# Patient Record
Sex: Male | Born: 1965 | Race: Black or African American | Hispanic: No | Marital: Single | Smoking: Current every day smoker
Health system: Southern US, Community
[De-identification: ages and names within clinical notes are randomized; demographics above are authoritative.]

## PROBLEM LIST (undated history)

## (undated) DIAGNOSIS — J45909 Unspecified asthma, uncomplicated: Secondary | ICD-10-CM

## (undated) DIAGNOSIS — Z720 Tobacco use: Secondary | ICD-10-CM

---

## 2003-04-01 ENCOUNTER — Inpatient Hospital Stay (HOSPITAL_COMMUNITY): Admission: EM | Admit: 2003-04-01 | Discharge: 2003-04-02 | Payer: Self-pay | Admitting: *Deleted

## 2005-11-10 ENCOUNTER — Emergency Department (HOSPITAL_COMMUNITY): Admission: EM | Admit: 2005-11-10 | Discharge: 2005-11-10 | Payer: Self-pay | Admitting: Emergency Medicine

## 2010-05-07 ENCOUNTER — Emergency Department (HOSPITAL_COMMUNITY)
Admission: EM | Admit: 2010-05-07 | Discharge: 2010-05-07 | Disposition: A | Discharge status: Home / Self Care | Payer: Self-pay | Attending: Emergency Medicine | Admitting: Emergency Medicine

## 2010-05-07 DIAGNOSIS — J45909 Unspecified asthma, uncomplicated: Secondary | ICD-10-CM | POA: Insufficient documentation

## 2010-05-07 DIAGNOSIS — M545 Low back pain, unspecified: Secondary | ICD-10-CM | POA: Insufficient documentation

## 2010-05-07 DIAGNOSIS — Y929 Unspecified place or not applicable: Secondary | ICD-10-CM | POA: Insufficient documentation

## 2010-05-07 DIAGNOSIS — F17200 Nicotine dependence, unspecified, uncomplicated: Secondary | ICD-10-CM | POA: Insufficient documentation

## 2010-05-07 DIAGNOSIS — X500XXA Overexertion from strenuous movement or load, initial encounter: Secondary | ICD-10-CM | POA: Insufficient documentation

## 2010-05-07 DIAGNOSIS — S335XXA Sprain of ligaments of lumbar spine, initial encounter: Secondary | ICD-10-CM | POA: Insufficient documentation

## 2010-07-31 ENCOUNTER — Emergency Department (HOSPITAL_COMMUNITY)
Admission: EM | Admit: 2010-07-31 | Discharge: 2010-07-31 | Disposition: A | Discharge status: Home / Self Care | Payer: Self-pay | Attending: Emergency Medicine | Admitting: Emergency Medicine

## 2010-07-31 DIAGNOSIS — R05 Cough: Secondary | ICD-10-CM | POA: Insufficient documentation

## 2010-07-31 DIAGNOSIS — J45901 Unspecified asthma with (acute) exacerbation: Secondary | ICD-10-CM | POA: Insufficient documentation

## 2010-07-31 DIAGNOSIS — R059 Cough, unspecified: Secondary | ICD-10-CM | POA: Insufficient documentation

## 2011-10-19 ENCOUNTER — Emergency Department (HOSPITAL_COMMUNITY)
Admission: EM | Admit: 2011-10-19 | Discharge: 2011-10-19 | Disposition: A | Discharge status: Home / Self Care | Payer: Self-pay | Attending: Emergency Medicine | Admitting: Emergency Medicine

## 2011-10-19 ENCOUNTER — Emergency Department (HOSPITAL_COMMUNITY): Payer: Self-pay

## 2011-10-19 ENCOUNTER — Encounter (HOSPITAL_COMMUNITY): Payer: Self-pay

## 2011-10-19 DIAGNOSIS — J45909 Unspecified asthma, uncomplicated: Secondary | ICD-10-CM | POA: Insufficient documentation

## 2011-10-19 DIAGNOSIS — Z760 Encounter for issue of repeat prescription: Secondary | ICD-10-CM | POA: Insufficient documentation

## 2011-10-19 DIAGNOSIS — J9801 Acute bronchospasm: Secondary | ICD-10-CM

## 2011-10-19 DIAGNOSIS — J40 Bronchitis, not specified as acute or chronic: Secondary | ICD-10-CM | POA: Insufficient documentation

## 2011-10-19 DIAGNOSIS — F17200 Nicotine dependence, unspecified, uncomplicated: Secondary | ICD-10-CM | POA: Insufficient documentation

## 2011-10-19 HISTORY — DX: Unspecified asthma, uncomplicated: J45.909

## 2011-10-19 LAB — POCT I-STAT, CHEM 8
BUN: 12 mg/dL (ref 6–23)
Calcium, Ion: 1.17 mmol/L (ref 1.12–1.23)
Chloride: 111 mEq/L (ref 96–112)
Creatinine, Ser: 1.5 mg/dL — ABNORMAL HIGH (ref 0.50–1.35)
Glucose, Bld: 105 mg/dL — ABNORMAL HIGH (ref 70–99)
HCT: 42 % (ref 39.0–52.0)
Hemoglobin: 14.3 g/dL (ref 13.0–17.0)
Potassium: 4 mEq/L (ref 3.5–5.1)
TCO2: 22 mmol/L (ref 0–100)

## 2011-10-19 LAB — CBC WITH DIFFERENTIAL/PLATELET
Basophils Absolute: 0.1 10*3/uL (ref 0.0–0.1)
Eosinophils Relative: 6 % — ABNORMAL HIGH (ref 0–5)
HCT: 38.7 % — ABNORMAL LOW (ref 39.0–52.0)
Hemoglobin: 13.4 g/dL (ref 13.0–17.0)
Lymphocytes Relative: 32 % (ref 12–46)
Lymphs Abs: 2.7 10*3/uL (ref 0.7–4.0)
MCH: 29.7 pg (ref 26.0–34.0)
Monocytes Absolute: 0.5 10*3/uL (ref 0.1–1.0)
Monocytes Relative: 6 % (ref 3–12)
Neutro Abs: 4.6 10*3/uL (ref 1.7–7.7)
Neutrophils Relative %: 56 % (ref 43–77)
Platelets: 314 10*3/uL (ref 150–400)
RBC: 4.51 MIL/uL (ref 4.22–5.81)
WBC: 8.3 10*3/uL (ref 4.0–10.5)

## 2011-10-19 LAB — POCT I-STAT TROPONIN I: Troponin i, poc: 0 ng/mL (ref 0.00–0.08)

## 2011-10-19 MED ORDER — ALBUTEROL SULFATE HFA 108 (90 BASE) MCG/ACT IN AERS
2.0000 | INHALATION_SPRAY | RESPIRATORY_TRACT | Status: DC | PRN
  Administered 2011-10-19: 2 via RESPIRATORY_TRACT
  Filled 2011-10-19: qty 6.7

## 2011-10-19 MED ORDER — PREDNISONE 20 MG PO TABS: 20.0000 mg | ORAL_TABLET | Freq: Two times a day (BID) | ORAL | Status: AC

## 2011-10-19 MED ORDER — AEROCHAMBER Z-STAT PLUS/MEDIUM MISC
1.0000 | Freq: Once | Status: AC
  Administered 2011-10-19: 1
  Filled 2011-10-19: qty 1

## 2011-10-19 MED ORDER — ALBUTEROL SULFATE HFA 108 (90 BASE) MCG/ACT IN AERS
2.0000 | INHALATION_SPRAY | RESPIRATORY_TRACT | Status: DC | PRN
Start: 2011-10-19 — End: 2015-04-13

## 2011-10-19 MED ORDER — PREDNISONE 20 MG PO TABS
60.0000 mg | ORAL_TABLET | Freq: Once | ORAL | Status: AC
  Administered 2011-10-19: 60 mg via ORAL
  Filled 2011-10-19: qty 3

## 2011-10-19 NOTE — ED Provider Notes (Addendum)
History     CSN: [id number]  Arrival date & time 10/19/11  1619   First MD Initiated Contact with Patient 10/19/11 2032      Chief Complaint  Patient presents with  . Asthma  . Shortness of Breath  . Medication Refill    (Consider location/radiation/quality/duration/timing/severity/associated sxs/prior treatment) HPI Comments: Luis Gay. is a 46 y.o. Male who was using his mother's inhaler, ran out, and has worsening, shortness of breath since. He denies cough, fever, or chills. He has mild, chest discomfort earlier, but it resolved while in the emergency department. No nausea, vomiting, sweating, weakness, dizziness, paresthesias, headache. He smokes cigarettes. There no known aggravating or palliative factors.  Patient is a 46 y.o. male presenting with asthma and shortness of breath. The history is provided by the patient.  Asthma Associated symptoms include shortness of breath.  Shortness of Breath  Associated symptoms include shortness of breath. His past medical history is significant for asthma.    Past Medical History  Diagnosis Date  . Asthma     History reviewed. No pertinent past surgical history.  History reviewed. No pertinent family history.  History  Substance Use Topics  . Smoking status: Current Everyday Smoker  . Smokeless tobacco: Not on file  . Alcohol Use: Yes      Review of Systems  Respiratory: Positive for shortness of breath.   All other systems reviewed and are negative.    Allergies  Review of patient's allergies indicates no known allergies.  Home Medications   Current Outpatient Rx  Name Route Sig Dispense Refill  . ALBUTEROL SULFATE HFA 108 (90 BASE) MCG/ACT IN AERS Inhalation Inhale 2 puffs into the lungs every 6 (six) hours as needed. For shortness of breath    . ALBUTEROL SULFATE HFA 108 (90 BASE) MCG/ACT IN AERS Inhalation Inhale 2 puffs into the lungs every 4 (four) hours as needed for wheezing. 8.5 g 0  .  PREDNISONE 20 MG PO TABS Oral Take 1 tablet (20 mg total) by mouth 2 (two) times daily. 10 tablet 0    BP 149/95  Pulse 80  Temp 98.1 F (36.7 C) (Oral)  Resp 15  SpO2 99%  Physical Exam  Nursing note and vitals reviewed. Constitutional: He is oriented to person, place, and time. He appears well-developed and well-nourished.  HENT:  Head: Normocephalic and atraumatic.  Right Ear: External ear normal.  Left Ear: External ear normal.  Eyes: Conjunctivae and EOM are normal. Pupils are equal, round, and reactive to light.  Neck: Normal range of motion and phonation normal. Neck supple.  Cardiovascular: Normal rate, regular rhythm, normal heart sounds and intact distal pulses.   Pulmonary/Chest: Effort normal. No respiratory distress. He has wheezes (generalized). He has no rales. He exhibits no tenderness and no bony tenderness.       No increased work of breathing.  Abdominal: Soft. Normal appearance. There is no tenderness.  Musculoskeletal: Normal range of motion.  Neurological: He is alert and oriented to person, place, and time. He has normal strength. No cranial nerve deficit or sensory deficit. He exhibits normal muscle tone. Coordination normal.  Skin: Skin is warm, dry and intact.  Psychiatric: He has a normal mood and affect. His behavior is normal. Judgment and thought content normal.    ED Course  Procedures (including critical care time)  Emergency department treatment: Prednisone, albuterol inhaler, and AeroChamber.    Date: 11/23/18  Rate: 111  Rhythm: sinus tachycardia  QRS Axis: normal  Intervals: normal  ST/T Wave abnormalities: nonspecific T wave changes  Conduction Disutrbances:none  Narrative Interpretation: poor R wave progression  Old EKG Reviewed: rate faster    Labs Reviewed  CBC WITH DIFFERENTIAL - Abnormal; Notable for the following:    HCT 38.7 (*)     Eosinophils Relative 6 (*)     All other components within normal limits  POCT I-STAT,  CHEM 8 - Abnormal; Notable for the following:    Creatinine, Ser 1.50 (*)     Glucose, Bld 105 (*)     All other components within normal limits  POCT I-STAT TROPONIN I   Dg Chest 2 View  10/19/2011  *RADIOLOGY REPORT*  Clinical Data: Shortness of breath and cough.  CHEST - 2 VIEW  Comparison: None.  Findings: Trachea is midline.  Heart size normal.  Lungs are clear. No pleural fluid.  IMPRESSION: No acute findings.  Original Report Authenticated By: Reyes Ivan, M.D.     1. Bronchitis   2. Bronchospasm       MDM  Bronchitis, with uncal spasm in smoker. No productive cough. Doubt ACS, PE, PNE. Doubt metabolic instability, serious bacterial infection or impending vascular collapse; the patient is stable for discharge.   Plan: Home Medications- Albuterol,Prednisone; Home Treatments- stop smoking; Recommended follow up- PCP of choice asap        Flint Melter, MD 10/20/11 0020  Flint Melter, MD 11/23/11 1651

## 2011-10-19 NOTE — ED Notes (Signed)
Pt d/c home. Verbalized understanding of inhaler and spacer. Demonstrated proper application. Verbalized signs and symptoms that indicate need to seek further treatment. A.O. X 4. Ambulatory. Skin warm, dry, and intact. Vitals stable. NAD. Parents at beside.

## 2011-10-19 NOTE — ED Notes (Signed)
Pt complains of hx of asthma and ran out of meds sevreal days ago, sts he feels sob and is getting worse.

## 2011-10-19 NOTE — ED Notes (Addendum)
Pt reports feeling SOB and x 3 days. States " He ran out of asthma medication, and today his SOB got worse". Cough is non productive.  Reports chest pain "when taking a deep breath or coughing". Non radiating. Pt denies any cardiac history. 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 Respirations even and unlabored. RR 18. SpO2 98% on 2L [place]. Pt. Sitting up in high fowler's. NAD.  Skin warm, dry, and intact. No further needs at this time.

## 2011-10-19 NOTE — ED Notes (Signed)
Pt. Reports SOB has decreased. Maintained on 2L [place] for comfort measures. States chest pain has decreased. 3/10.

## 2013-09-02 ENCOUNTER — Inpatient Hospital Stay (HOSPITAL_COMMUNITY)
Admission: EM | Admit: 2013-09-02 | Discharge: 2013-09-06 | DRG: 203 | Disposition: A | Discharge status: Home / Self Care | Payer: No Typology Code available for payment source | Attending: Internal Medicine | Admitting: Internal Medicine

## 2013-09-02 ENCOUNTER — Emergency Department (HOSPITAL_COMMUNITY): Payer: No Typology Code available for payment source

## 2013-09-02 ENCOUNTER — Encounter (HOSPITAL_COMMUNITY): Payer: Self-pay | Admitting: Emergency Medicine

## 2013-09-02 DIAGNOSIS — F1210 Cannabis abuse, uncomplicated: Secondary | ICD-10-CM | POA: Diagnosis present

## 2013-09-02 DIAGNOSIS — F17200 Nicotine dependence, unspecified, uncomplicated: Secondary | ICD-10-CM | POA: Diagnosis present

## 2013-09-02 DIAGNOSIS — I498 Other specified cardiac arrhythmias: Secondary | ICD-10-CM | POA: Diagnosis present

## 2013-09-02 DIAGNOSIS — R000 Tachycardia, unspecified: Secondary | ICD-10-CM

## 2013-09-02 DIAGNOSIS — E876 Hypokalemia: Secondary | ICD-10-CM | POA: Diagnosis present

## 2013-09-02 DIAGNOSIS — J45901 Unspecified asthma with (acute) exacerbation: Principal | ICD-10-CM | POA: Diagnosis present

## 2013-09-02 DIAGNOSIS — Z79899 Other long term (current) drug therapy: Secondary | ICD-10-CM

## 2013-09-02 DIAGNOSIS — F17210 Nicotine dependence, cigarettes, uncomplicated: Secondary | ICD-10-CM

## 2013-09-02 HISTORY — DX: Tobacco use: Z72.0

## 2013-09-02 LAB — CBC
HCT: 37.8 % — ABNORMAL LOW (ref 39.0–52.0)
Hemoglobin: 13.2 g/dL (ref 13.0–17.0)
MCH: 29.5 pg (ref 26.0–34.0)
MCHC: 34.9 g/dL (ref 30.0–36.0)
MCV: 84.4 fL (ref 78.0–100.0)
Platelets: 284 10*3/uL (ref 150–400)
RBC: 4.48 MIL/uL (ref 4.22–5.81)
RDW: 13.3 % (ref 11.5–15.5)
WBC: 6.7 10*3/uL (ref 4.0–10.5)

## 2013-09-02 LAB — BASIC METABOLIC PANEL
BUN: 11 mg/dL (ref 6–23)
CALCIUM: 9.4 mg/dL (ref 8.4–10.5)
CO2: 22 meq/L (ref 19–32)
Chloride: 100 mEq/L (ref 96–112)
Creatinine, Ser: 1 mg/dL (ref 0.50–1.35)
GFR calc Af Amer: >90 mL/min (ref >90)
GFR calc non Af Amer: 87 mL/min — ABNORMAL LOW (ref >90)
Glucose, Bld: 261 mg/dL — ABNORMAL HIGH (ref 70–99)
Potassium: 3.1 mEq/L — ABNORMAL LOW (ref 3.7–5.3)
Sodium: 141 mEq/L (ref 137–147)

## 2013-09-02 LAB — GLUCOSE, CAPILLARY: GLUCOSE-CAPILLARY: 243 mg/dL — AB (ref 70–99)

## 2013-09-02 MED ORDER — ALBUTEROL SULFATE (2.5 MG/3ML) 0.083% IN NEBU: 2.5000 mg | INHALATION_SOLUTION | RESPIRATORY_TRACT | Status: DC | PRN

## 2013-09-02 MED ORDER — HEPARIN SODIUM (PORCINE) 5000 UNIT/ML IJ SOLN
5000.0000 [IU] | Freq: Three times a day (TID) | INTRAMUSCULAR | Status: DC
  Filled 2013-09-02 (×2): qty 1

## 2013-09-02 MED ORDER — ACETAMINOPHEN 650 MG RE SUPP
650.0000 mg | Freq: Four times a day (QID) | RECTAL | Status: DC | PRN
Start: 2013-09-02 — End: 2013-09-06

## 2013-09-02 MED ORDER — ONDANSETRON HCL 4 MG PO TABS: 4.0000 mg | ORAL_TABLET | Freq: Four times a day (QID) | ORAL | Status: DC | PRN

## 2013-09-02 MED ORDER — INSULIN ASPART 100 UNIT/ML ~~LOC~~ SOLN
0.0000 [IU] | Freq: Three times a day (TID) | SUBCUTANEOUS | Status: DC
  Administered 2013-09-03 – 2013-09-04 (×4): 1 [IU] via SUBCUTANEOUS
  Administered 2013-09-05: 2 [IU] via SUBCUTANEOUS
  Administered 2013-09-05: 3 [IU] via SUBCUTANEOUS
  Administered 2013-09-05: 2 [IU] via SUBCUTANEOUS
  Administered 2013-09-06: 3 [IU] via SUBCUTANEOUS

## 2013-09-02 MED ORDER — AZITHROMYCIN 500 MG PO TABS
500.0000 mg | ORAL_TABLET | Freq: Every day | ORAL | Status: AC
  Administered 2013-09-02: 500 mg via ORAL
  Filled 2013-09-02: qty 1

## 2013-09-02 MED ORDER — ONDANSETRON HCL 4 MG/2ML IJ SOLN: 4.0000 mg | Freq: Four times a day (QID) | INTRAMUSCULAR | Status: DC | PRN

## 2013-09-02 MED ORDER — INSULIN ASPART 100 UNIT/ML ~~LOC~~ SOLN
0.0000 [IU] | Freq: Every day | SUBCUTANEOUS | Status: DC
  Administered 2013-09-02 – 2013-09-04 (×2): 2 [IU] via SUBCUTANEOUS

## 2013-09-02 MED ORDER — ACETAMINOPHEN 325 MG PO TABS: 650.0000 mg | ORAL_TABLET | Freq: Four times a day (QID) | ORAL | Status: DC | PRN

## 2013-09-02 MED ORDER — POTASSIUM CHLORIDE CRYS ER 20 MEQ PO TBCR
40.0000 meq | EXTENDED_RELEASE_TABLET | Freq: Once | ORAL | Status: AC
  Administered 2013-09-02: 40 meq via ORAL
  Filled 2013-09-02: qty 2

## 2013-09-02 MED ORDER — BISACODYL 5 MG PO TBEC
5.0000 mg | DELAYED_RELEASE_TABLET | Freq: Every day | ORAL | Status: DC | PRN
Start: 2013-09-02 — End: 2013-09-06

## 2013-09-02 MED ORDER — ALBUTEROL (5 MG/ML) CONTINUOUS INHALATION SOLN
10.0000 mg/h | INHALATION_SOLUTION | Freq: Once | RESPIRATORY_TRACT | Status: AC
  Administered 2013-09-02: 10 mg/h via RESPIRATORY_TRACT

## 2013-09-02 MED ORDER — IPRATROPIUM-ALBUTEROL 0.5-2.5 (3) MG/3ML IN SOLN
3.0000 mL | RESPIRATORY_TRACT | Status: DC
Start: 2013-09-02 — End: 2013-09-06
  Administered 2013-09-02 – 2013-09-06 (×21): 3 mL via RESPIRATORY_TRACT
  Filled 2013-09-02 (×22): qty 3

## 2013-09-02 MED ORDER — METHYLPREDNISOLONE SODIUM SUCC 125 MG IJ SOLR
60.0000 mg | Freq: Two times a day (BID) | INTRAMUSCULAR | Status: DC
  Administered 2013-09-02: 23:00:00 via INTRAVENOUS
  Filled 2013-09-02 (×3): qty 0.96

## 2013-09-02 MED ORDER — AZITHROMYCIN 250 MG PO TABS
250.0000 mg | ORAL_TABLET | Freq: Every day | ORAL | Status: AC
  Administered 2013-09-03 – 2013-09-06 (×4): 250 mg via ORAL
  Filled 2013-09-02 (×5): qty 1

## 2013-09-02 MED ORDER — POLYETHYLENE GLYCOL 3350 17 G PO PACK
17.0000 g | PACK | Freq: Every day | ORAL | Status: DC | PRN
  Filled 2013-09-02: qty 1

## 2013-09-02 MED ORDER — BUDESONIDE 0.5 MG/2ML IN SUSP
0.5000 mg | Freq: Two times a day (BID) | RESPIRATORY_TRACT | Status: DC
  Administered 2013-09-02 – 2013-09-06 (×8): 0.5 mg via RESPIRATORY_TRACT
  Filled 2013-09-02 (×15): qty 2

## 2013-09-02 MED ORDER — ENOXAPARIN SODIUM 40 MG/0.4ML ~~LOC~~ SOLN
40.0000 mg | Freq: Every day | SUBCUTANEOUS | Status: DC
  Administered 2013-09-02: 40 mg via SUBCUTANEOUS
  Filled 2013-09-02 (×2): qty 0.4

## 2013-09-02 MED ORDER — ARFORMOTEROL TARTRATE 15 MCG/2ML IN NEBU
15.0000 ug | INHALATION_SOLUTION | Freq: Two times a day (BID) | RESPIRATORY_TRACT | Status: DC
  Administered 2013-09-02 – 2013-09-06 (×8): 15 ug via RESPIRATORY_TRACT
  Filled 2013-09-02 (×12): qty 2

## 2013-09-02 NOTE — ED Notes (Signed)
Patient states he was taking proair but did not have money to get refill and providers [facility] gave him proventil HFA instead, which he states has not been helping. Patient denies using anyother medications for asthma.

## 2013-09-02 NOTE — ED Notes (Signed)
Bed: WA01 Expected date:  Expected time:  Means of arrival:  Comments: ems- asthma

## 2013-09-02 NOTE — ED Provider Notes (Signed)
  Face-to-face evaluation   History: Shortness of breath. Today, somewhat improved after initial treatment. In a similar recently.  Physical exam: Alert, cooperative. Mild increased work of breathing. Lungs with decreased air movement bilaterally, and generalized wheezing. This exam was after the first continuous nebulizer treatment.  Medical screening examination/treatment/procedure(s) were conducted as a shared visit with non-physician practitioner(s) and myself.  I personally evaluated the patient during the encounter  Flint MelterElliott L Wentz, MD 09/03/13 [phone]

## 2013-09-02 NOTE — ED Provider Notes (Signed)
CSN: [id number]     Arrival date & time 09/02/13  1502 History   First MD Initiated Contact with Patient 09/02/13 1512     Chief Complaint  Patient presents with  . 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  Pt states he called EMS around 11am this morning, was given 1 nebulizer tx, felt better at that time and declined transfer to ED.  Around 3pm, asthma flared up again, pt called EMS and was given 10mg  albuterol, 0.5mg  atrovent and 125mg  solu-medrol just PTA.  Pt still c/o dyspnea upon arrival to ED.  Pt is alert and oriented, denies chest pain but c/o chest tightness.  States he has been hospitalized about 7874yr ago for his asthma. Pt is a smoker, smokes about 1 pack every 3 days.  States he does have an albuterol inhaler at home which he has needed to use more often today w/o relief. States he was taking ProAir but did not have money to refill and providers [facility] gave him Proventil HFA instead, which he states has not been helping. Denies any other daily medications.  Denies recent illness including fever, congestion, n/v/d. Pt does report dry cough consistent with his asthma attack. No sick contact or recent travel.  Denies hx of CAD. Denies hx of blood clots, leg pain or leg swelling.   Past Medical History  Diagnosis Date  . Asthma    History reviewed. No pertinent past surgical history. History reviewed. No pertinent family history. History  Substance Use Topics  . Smoking status: Current Every Day Smoker  . Smokeless tobacco: Not on file  . Alcohol Use: Yes    Review of Systems  Constitutional: Negative for fever, chills, diaphoresis and fatigue.  HENT: Negative for congestion, sore throat, trouble swallowing and voice change.   Respiratory: Positive  for cough ( dry), chest tightness, shortness of breath and wheezing. Negative for choking and stridor.   Cardiovascular: Negative for chest pain and palpitations.  Gastrointestinal: Negative for nausea, vomiting, abdominal pain and diarrhea.  All other systems reviewed and are negative.     Allergies  Review of patient's allergies indicates no known allergies.  Home Medications   Prior to Admission medications   Medication Sig Start Date End Date Taking? Authorizing Provider  albuterol (PROVENTIL HFA;VENTOLIN HFA) 108 (90 BASE) MCG/ACT inhaler Inhale 2 puffs into the lungs every 6 (six) hours as needed. For shortness of breath   Yes Historical Provider, MD  albuterol (PROVENTIL HFA;VENTOLIN HFA) 108 (90 BASE) MCG/ACT inhaler Inhale 2 puffs into the lungs every 4 (four) hours as needed for wheezing. 10/19/11 10/18/12  Flint MelterElliott L Wentz, MD   BP 126/70  Pulse 118  Temp(Src) 98.4 F (36.9 C) (Oral)  Resp 18  SpO2 98% Physical Exam  Nursing note and vitals reviewed. Constitutional: He appears well-developed and well-nourished.  HENT:  Head: Normocephalic and atraumatic.  Eyes: Conjunctivae are normal. No scleral icterus.  Neck: Normal range of motion.  Cardiovascular: Regular rhythm and normal heart sounds.  Tachycardia present.   Mildly tachycardic   Pulmonary/Chest: He is in respiratory distress ( mild). He has wheezes. He has no rales. He exhibits no tenderness.  Mild respiratory distress, taking a deep breath between sentences.  Diffuse inspiratory and expiratory wheeze in all lung fields. No  rhonchi or rales. No chest wall tenderness.   Abdominal: Soft. Bowel sounds are normal. He exhibits no distension and no mass. There is no tenderness. There is no rebound and no guarding.  Musculoskeletal: Normal range of motion.  Neurological: He is alert.  Skin: Skin is warm and dry.    ED Course  Procedures (including critical care time) Labs Review Labs Reviewed - No data to  display  Imaging Review Dg Chest 2 View  09/02/2013   CLINICAL DATA:  Chest pain, cough, congestion, shortness of breath.  EXAM: CHEST  2 VIEW  COMPARISON:  10/19/2011  FINDINGS: Cardiomediastinal silhouette is within normal limits. The lungs are well inflated. There is a 7 mm round density projecting over the right mid to lower lung on the PA image, not clearly seen on the prior study. The lungs are otherwise clear. No pleural effusion or pneumothorax is identified. No acute osseous abnormality is seen.  IMPRESSION: 1. No evidence of acute airspace disease. 2. 6 mm density overlying the right lung, possibly a nipple shadow. Consider repeat radiograph with nipple markers.   Electronically Signed   By: Sebastian AcheAllen  Grady   On: 09/02/2013 19:19     EKG Interpretation None      MDM   Final diagnoses:  Asthma exacerbation    Pt is a 48yo male with hx of asthma, smokes about 1 pack cigarrettes every 3 days, works outside as a Scientist, water qualitybrick mason, presenting to ED via EMS with asthma exacerbation.  Pt reports having 2 tx of albuterol and atrovent neb via EMS PTA with solu-medrol given PTA.  Pt denies recent illness.  Pt does have mild respiratory distress, speaking in broken sentences with diffuse inspiratory and expiratory wheeze.  Will given continuous 1 hour long neb and reassess. PERC negative. Pt denies CP, low risk for ACS, not concerned for pneumonia, do not believe CXR needed at this time. Not concerned for pneumothorax, pt denies hx of trauma.   4:23 PM Pt started continuous neb tx around 3:27PM, states he is feeling a lot better, medication still left in neb tx.  Wheezing still present but has improved. Will allow pt to complete tx and reassess again. Due to not improving fully after 1st continuous neb tx, will get CXR to ensure no other underlying cause of asthma exacerbation such as CHF.   7:28 PM CXR- no evidence of acute airspace disease.  Discussed findings with pt.  Pt states 2nd continuous neb tx  did not help more than the 1st tx. Pt states he feels like if he was discharged now, he would be back with an asthma exacerbation as pt states he still feels winded.  Lungs: diffuse inspiratory and expiratory wheeze throughout all lung fields.  Discussed pt with Dr. Effie ShyWentz, mild tachycardia present, 118, likely due to albuterol tx.  Will call hospitalist to admit pt for asthma exacerbation tx.    7:38 PM Consulted with Dr. Renae FickleMacKenzie Short, internal medicine, pt will be admitted to a med-surg bed. Pt is stable at this time.       Junius Finnerrin O'Malley, PA-C 09/02/13 1939

## 2013-09-02 NOTE — ED Notes (Signed)
PER EMS[place] asthma x1 [place]. PTA given 10mg  albuterol .5 atrovent and 125mg  solu-medrol.  18g LAC.  Hx of asthma.  Arrived to ED on 8l of o2 on neb treatment. Alert and oriented per baseline.

## 2013-09-02 NOTE — H&P (Addendum)
[facility] History and Physical  Luis Hitcheginald A Vickroy Jr. ZOX:[id number] DOB: 11-08-1965 DOA: 09/02/2013  Referring physician:  Gray BernhardtElliot Wentz PCP:  Default, Provider, MD   Chief Complaint:  SOB  HPI:  The patient is a 48 y.o. year-old male with history of asthma since birth who presents with wheezing, shortness of breath, increased cough since this morning.   At baseline, the patient is not on any controller medications, however he has daytime and nighttime cough on a daily basis. He uses is albuterol inhaler at least 2-3 times every day.  His turgor is included colds, but temperature extremes are his worst trigger and he works outside.  He has on average 2-3 exacerbations per year and has been hospitalized previously. This exacerbation feels less severe than some of his previous exacerbations. He denies any recent fevers or chills, productive cough, sinus congestion. Yesterday he felt fine, however this morning he woke up feeling slightly more short of breath. He went to work, but needed to use his albuterol inhaler frequently. He called EMS at work and they gave him a breathing treatment which helped so he went home. After he arrived home, he immediately started feeling more short of breath again, and called EMS to pick him up and taken to the hospital.  In the emergency department, he received Solu-Medrol 125 mg IV once, 2 rounds of continuous albuterol times one hour. He remained stable on room air, but is still feeling considerably short of breath compared to his baseline and even at rest. He is being admitted for asthma exacerbation.  Review of Systems:  General:  Denies fevers, chills, weight loss or weight gain  HEENT:  Denies changes to hearing and vision, rhinorrhea, sinus congestion, sore throat CV:  Denies chest pain and palpitations, lower extremity edema.  PULM:   per history of present illness  GI:  Denies nausea, vomiting, constipation, diarrhea.   GU:  Denies dysuria,  frequency, urgency ENDO:  Denies polyuria, polydipsia.   HEME:  Denies hematemesis, blood in stools, melena, abnormal bruising or bleeding.  LYMPH:  Denies lymphadenopathy.   MSK:  Denies arthralgias, myalgias.   DERM:  Denies skin rash or ulcer.   NEURO:  Denies focal numbness, weakness, slurred speech, confusion, facial droop.  PSYCH:  Denies anxiety and depression.    Past Medical History  Diagnosis Date  . Asthma   . Tobacco use    History reviewed. No pertinent past surgical history. Social History:  reports that he has been smoking Cigarettes.  He has a 6 pack-year smoking history. He does not have any smokeless tobacco history on file. He reports that he drinks alcohol. He reports that he uses illicit drugs (Marijuana). Works outside  No Known Allergies  Family History  Problem Relation Age of Onset  . Asthma Father   . Diabetes    . High blood pressure       Prior to Admission medications   Medication Sig Start Date End Date Taking? Authorizing Provider  albuterol (PROVENTIL HFA;VENTOLIN HFA) 108 (90 BASE) MCG/ACT inhaler Inhale 2 puffs into the lungs every 6 (six) hours as needed. For shortness of breath   Yes Historical Provider, MD  albuterol (PROVENTIL HFA;VENTOLIN HFA) 108 (90 BASE) MCG/ACT inhaler Inhale 2 puffs into the lungs every 4 (four) hours as needed for wheezing. 10/19/11 10/18/12  Flint MelterElliott L Wentz, MD   Physical Exam: Filed Vitals:   09/02/13 1511 09/02/13 1532 09/02/13 1729 09/02/13 1918  BP: 152/84   126/70  Pulse:  105   118  Temp: 98.4 F (36.9 C)     TempSrc: Oral     Resp: 22   18  SpO2: 98% 98% 98% 98%     General:  Black male, able to speak in full sentences but with obvious wheezing and increased work of breathing   Eyes:  PERRL, anicteric, non-injected.  ENT:  Nares clear.  OP clear, non-erythematous without plaques or exudates.  MMM.  Neck:  Supple without TM or JVD.    Lymph:  No cervical, supraclavicular, or submandibular  LAD.  Cardiovascular:  RRR, normal S1, S2, without m/r/g.  2+ pulses, warm extremities  Respiratory:   diminished bilateral breath sounds with full inspiratory and expiratory wheeze, no focal rales or rhonchi, forced expiratory phase with I:E of 1:4-5 and occasional SCM retractions when speaking   Abdomen:  NABS.  Soft, ND/NT.    Skin:  No rashes or focal lesions.  Musculoskeletal:  Normal bulk and tone.  No LE edema.  Psychiatric:  A & O x 4.  Appropriate affect.  Neurologic:  CN 3-12 intact.  5/5 strength.  Sensation intact.  Labs on Admission:  Basic Metabolic Panel: No results found for this basename: NA, K, CL, CO2, GLUCOSE, BUN, CREATININE, CALCIUM, MG, PHOS,  in the last 168 hours Liver Function Tests: No results found for this basename: AST, ALT, ALKPHOS, BILITOT, PROT, ALBUMIN,  in the last 168 hours No results found for this basename: LIPASE, AMYLASE,  in the last 168 hours No results found for this basename: AMMONIA,  in the last 168 hours CBC: No results found for this basename: WBC, NEUTROABS, HGB, HCT, MCV, PLT,  in the last 168 hours Cardiac Enzymes: No results found for this basename: CKTOTAL, CKMB, CKMBINDEX, TROPONINI,  in the last 168 hours  BNP (last 3 results) No results found for this basename: PROBNP,  in the last 8760 hours CBG: No results found for this basename: GLUCAP,  in the last 168 hours  Radiological Exams on Admission: Dg Chest 2 View  09/02/2013   CLINICAL DATA:  Chest pain, cough, congestion, shortness of breath.  EXAM: CHEST  2 VIEW  COMPARISON:  10/19/2011  FINDINGS: Cardiomediastinal silhouette is within normal limits. The lungs are well inflated. There is a 7 mm round density projecting over the right mid to lower lung on the PA image, not clearly seen on the prior study. The lungs are otherwise clear. No pleural effusion or pneumothorax is identified. No acute osseous abnormality is seen.  IMPRESSION: 1. No evidence of acute airspace disease.  2. 6 mm density overlying the right lung, possibly a nipple shadow. Consider repeat radiograph with nipple markers.   Electronically Signed   By: Sebastian AcheAllen  Grady   On: 09/02/2013 19:19    EKG: pending  Assessment/Plan Active Problems:   Asthma exacerbation   Cigarette smoker one half pack a day or less   Tachycardia  ---  Asthma exacerbation superimposed on severe persistent asthma -  Encourage smoking cessation -  Continue Solu-Medrol -  DuoNeb every 4 hours with albuterol every 2 hours when necessary -  Given smoking history, he may have some underlying COPD so will start him on antibiotics -  We will start brovana and pulmicort for now, and he will need outpatient controller medications  -  Recommend outpatient Pulm f/u to determine if he also has some COPD and to assist with maintenance -  Repeat CXR due to possible nipple shadow on initial CXR  Tachycardia, likely sinus tach.   -  ECG  Tobacco and marijuana use -  Counseled cessation  Previous creatinine of 1.5 several years ago -  Check creatinine:  wnl  Hyperglycemia, likely steroid induced, but will eval for diabetes -  A1c -  Start SSI -  Change to diabetic diet  Hypokalemia, likely due to albuterol -  Oral potassium repletion  Diet:  diabetic Access:   PIV  IVF:   off  Proph:  lovenox  Code Status: Full  Family Communication:  patient alone  Disposition Plan: Admit to MedSurg   Time spent: 60 min SHORT, MACKENZIE [facility] Pager [phone]  If 7PM-7AM, please contact night-coverage [email or web address] Password [facility] 09/02/2013, 8:27 PM

## 2013-09-03 ENCOUNTER — Encounter (HOSPITAL_COMMUNITY): Payer: Self-pay | Admitting: *Deleted

## 2013-09-03 ENCOUNTER — Inpatient Hospital Stay (HOSPITAL_COMMUNITY): Payer: No Typology Code available for payment source

## 2013-09-03 LAB — GLUCOSE, CAPILLARY
Glucose-Capillary: 122 mg/dL — ABNORMAL HIGH (ref 70–99)
Glucose-Capillary: 125 mg/dL — ABNORMAL HIGH (ref 70–99)

## 2013-09-03 LAB — HEMOGLOBIN A1C
Hgb A1c MFr Bld: 6.1 % — ABNORMAL HIGH (ref <5.7)
Mean Plasma Glucose: 128 mg/dL — ABNORMAL HIGH (ref <117)

## 2013-09-03 MED ORDER — ALUM & MAG HYDROXIDE-SIMETH 200-200-20 MG/5ML PO SUSP
15.0000 mL | Freq: Four times a day (QID) | ORAL | Status: DC | PRN
  Administered 2013-09-03: via ORAL
  Administered 2013-09-04 – 2013-09-06 (×5): 15 mL via ORAL
  Filled 2013-09-03 (×6): qty 30

## 2013-09-03 MED ORDER — POTASSIUM CHLORIDE CRYS ER 20 MEQ PO TBCR
40.0000 meq | EXTENDED_RELEASE_TABLET | Freq: Two times a day (BID) | ORAL | Status: AC
  Administered 2013-09-03 (×2): 40 meq via ORAL
  Filled 2013-09-03 (×2): qty 2

## 2013-09-03 MED ORDER — METHYLPREDNISOLONE SODIUM SUCC 40 MG IJ SOLR
40.0000 mg | Freq: Two times a day (BID) | INTRAMUSCULAR | Status: DC
  Administered 2013-09-03 – 2013-09-04 (×3): 40 mg via INTRAVENOUS
  Filled 2013-09-03 (×3): qty 1

## 2013-09-03 MED ORDER — ENOXAPARIN SODIUM 60 MG/0.6ML ~~LOC~~ SOLN
50.0000 mg | SUBCUTANEOUS | Status: DC
  Administered 2013-09-03 – 2013-09-05 (×3): 50 mg via SUBCUTANEOUS
  Filled 2013-09-03 (×4): qty 0.6

## 2013-09-03 NOTE — Progress Notes (Signed)
Due to obesity, will increase Lovenox to 50mg (0.5mg /kg) q24h.  Charolotte Ekeom Firmin Belisle, PharmD, pager [phone]. 09/03/2013,7:57 AM.

## 2013-09-03 NOTE — Progress Notes (Signed)
ANTICOAGULATION CONSULT NOTE - Initial Consult  Pharmacy Consult for enoxaparin Indication: VTE prophylaxis  No Known Allergies  Patient Measurements: Height: 5\' 9"  (175.3 cm) Weight: 230 lb (104.327 kg) IBW/kg (Calculated) : 70.7 Heparin Dosing Weight:   Vital Signs: Temp: 98.7 F (37.1 C) (06/27 2225) Temp src: Oral (06/27 2225) BP: 127/71 mmHg (06/27 2225) Pulse Rate: 91 (06/27 2225)  Labs:  Recent Labs  09/02/13 2105  HGB 13.2  HCT 37.8*  PLT 284  CREATININE 1.00    Estimated Creatinine Clearance: 106.3 ml/min (by C-G formula based on Cr of 1).   Medical History: Past Medical History  Diagnosis Date  . Asthma   . Tobacco use     Medications:  Scheduled:  . arformoterol  15 mcg Nebulization BID  . azithromycin  250 mg Oral Daily  . budesonide (PULMICORT) nebulizer solution  0.5 mg Nebulization BID  . enoxaparin (LOVENOX) injection  40 mg Subcutaneous QHS  . insulin aspart  0-5 Units Subcutaneous QHS  . insulin aspart  0-9 Units Subcutaneous TID WC  . ipratropium-albuterol  3 mL Nebulization Q4H  . methylPREDNISolone (SOLU-MEDROL) injection  60 mg Intravenous BID    Assessment: Patient with order for pharmacy to dose enoxaparin for VTE prophylaxis.    Goal of Therapy:  Enoxaparin dosed based on patient weight and renal function  Monitor platelets by anticoagulation protocol: Yes   Plan:  Enoxaparin 40mg  sq q24hr  Darlina GuysGrimsley Jr, Walt Geathers Crowford 09/03/2013,1:25 AM

## 2013-09-03 NOTE — Progress Notes (Addendum)
Patient ID: Luis Hitcheginald A Thorns Jr., male   DOB: 1965/08/16, 48 y.o.   MRN: [id number]  [facility] PROGRESS NOTE  Luis Hitcheginald A Landowski Jr. JXB:[id number] DOB: 1965/08/16 DOA: 09/02/2013 PCP: Default, Provider, MD  Brief narrative: 48 y.o. year-old male with history of asthma since birth who presented with wheezing, shortness of breath, increased cough x 1 day. At baseline, the patient is not on any controller medications, however he has daytime and nighttime cough on a daily basis. He uses is albuterol inhaler at least 2-3 times every day. He has on average 2-3 exacerbations per year and has been hospitalized previously. This exacerbation feels less severe than some of his previous exacerbations. He denies any recent fevers or chills, productive cough, sinus congestion.   In the ED, he received Solu-Medrol 125 mg IV once, 2 rounds of continuous albuterol times one hour. He remained stable on room air, but felt considerably short of breath compared to his baseline and even at rest. He was admitted for asthma exacerbation.   Active Problems:   Asthma exacerbation - pt clinically improving and maintaining oxygen saturation at target range - continue current medical regimen with BD's scheduled and as needed  - continue solumedrol and plan on tapering down in AM if pt clinically stable - continue empiric ABX   Hypokalemia - from BD's, supplement as indicated    Cigarette smoker one half pack a day or less - smoking cessation discussed in detail    Tachycardia - possibly related to BD's - no chest pain this AM  Consultants:  None   Procedures/Studies: CXR  09/03/2013  No acute cardiopulmonary disease.   CXR  09/02/2013  No evidence of acute airspace disease. 2. 6 mm density overlying the right lung, ? nipple shadow.  Antibiotics:  Zithromax 6/27 -->  Code Status: Full Family Communication: Pt at bedside Disposition Plan: Home when medically stable  HPI/Subjective: No events  overnight.   Objective: Filed Vitals:   09/02/13 2225 09/02/13 2300 09/03/13 0545 09/03/13 0908  BP: 127/71  146/80   Pulse: 91  84   Temp: 98.7 F (37.1 C)  97.9 F (36.6 C)   TempSrc: Oral  Oral   Resp: 20  18   Height:  5\' 9"  (1.753 m)    Weight:  104.327 kg (230 lb)    SpO2: 97%  97% 96%    Intake/Output Summary (Last 24 hours) at 09/03/13 0948 Last data filed at 09/03/13 0824  Gross per 24 hour  Intake    600 ml  Output      0 ml  Net    600 ml    Exam:   General:  Pt is alert, follows commands appropriately, not in acute distress  Cardiovascular: Regular rate and rhythm, S1/S2, no murmurs, no rubs, no gallops  Respiratory: Clear to auscultation bilaterally, no wheezing, diminished breath sounds at bases  Abdomen: Soft, non tender, non distended, bowel sounds present, no guarding  Extremities: No edema, pulses DP and PT palpable bilaterally  Neuro: Grossly nonfocal  Data Reviewed: Basic Metabolic Panel:  Recent Labs Lab 09/02/13 2105  NA 141  K 3.1*  CL 100  CO2 22  GLUCOSE 261*  BUN 11  CREATININE 1.00  CALCIUM 9.4   CBC:  Recent Labs Lab 09/02/13 2105  WBC 6.7  HGB 13.2  HCT 37.8*  MCV 84.4  PLT 284   CBG:  Recent Labs Lab 09/02/13 2321 09/03/13 0711  GLUCAP 243* 122*   Scheduled Meds: .  arformoterol  15 mcg Nebulization BID  . azithromycin  250 mg Oral Daily  . budesonide (PULMICORT) nebulizer solution  0.5 mg Nebulization BID  . enoxaparin (LOVENOX) injection  50 mg Subcutaneous Q24H  . insulin aspart  0-5 Units Subcutaneous QHS  . insulin aspart  0-9 Units Subcutaneous TID WC  . ipratropium-albuterol  3 mL Nebulization Q4H  . methylPREDNISolone (SOLU-MEDROL) injection  60 mg Intravenous BID   Continuous Infusions:   Debbora PrestoMAGICK-Gustava Berland, MD  [facility] Pager [phone]  If 7PM-7AM, please contact night-coverage [email or web address] Password [email or web address] 09/03/2013, 9:48 AM   LOS: 1 day

## 2013-09-04 LAB — BASIC METABOLIC PANEL
BUN: 12 mg/dL (ref 6–23)
CO2: 24 meq/L (ref 19–32)
Calcium: 9.9 mg/dL (ref 8.4–10.5)
Chloride: 99 mEq/L (ref 96–112)
Creatinine, Ser: 0.94 mg/dL (ref 0.50–1.35)
GFR calc Af Amer: >90 mL/min (ref >90)
GFR calc non Af Amer: >90 mL/min (ref >90)
GLUCOSE: 136 mg/dL — AB (ref 70–99)
Potassium: 4.4 mEq/L (ref 3.7–5.3)
Sodium: 136 mEq/L — ABNORMAL LOW (ref 137–147)

## 2013-09-04 LAB — CBC
HCT: 41.1 % (ref 39.0–52.0)
Hemoglobin: 14 g/dL (ref 13.0–17.0)
MCH: 28.9 pg (ref 26.0–34.0)
MCHC: 34.1 g/dL (ref 30.0–36.0)
MCV: 84.9 fL (ref 78.0–100.0)
Platelets: 325 10*3/uL (ref 150–400)
RBC: 4.84 MIL/uL (ref 4.22–5.81)
RDW: 13.6 % (ref 11.5–15.5)
WBC: 15.2 10*3/uL — ABNORMAL HIGH (ref 4.0–10.5)

## 2013-09-04 LAB — GLUCOSE, CAPILLARY
GLUCOSE-CAPILLARY: 129 mg/dL — AB (ref 70–99)
Glucose-Capillary: 136 mg/dL — ABNORMAL HIGH (ref 70–99)
Glucose-Capillary: 152 mg/dL — ABNORMAL HIGH (ref 70–99)
Glucose-Capillary: 119 mg/dL — ABNORMAL HIGH (ref 70–99)
Glucose-Capillary: 149 mg/dL — ABNORMAL HIGH (ref 70–99)
Glucose-Capillary: 215 mg/dL — ABNORMAL HIGH (ref 70–99)

## 2013-09-04 MED ORDER — METHYLPREDNISOLONE SODIUM SUCC 125 MG IJ SOLR
60.0000 mg | INTRAMUSCULAR | Status: DC
  Administered 2013-09-04 – 2013-09-06 (×11): 60 mg via INTRAVENOUS
  Filled 2013-09-04 (×24): qty 0.96

## 2013-09-04 NOTE — Progress Notes (Signed)
Patient ID: Luis Hitcheginald A Salle Jr., male   DOB: 1965/04/28, 48 y.o.   MRN: [id number]  [facility] PROGRESS NOTE  Luis Hitcheginald A Lafontaine Jr. [id number] DOB: 1965/04/28 DOA: 09/02/2013 PCP: Default, Provider, MD  Brief narrative:  48 y.o. year-old male with history of asthma since birth who presented with wheezing, shortness of breath, increased cough x 1 day. At baseline, the patient is not on any controller medications, however he has daytime and nighttime cough on a daily basis. He uses is albuterol inhaler at least 2-3 times every day. He has on average 2-3 exacerbations per year and has been hospitalized previously. This exacerbation feels less severe than some of his previous exacerbations. He denies any recent fevers or chills, productive cough, sinus congestion.   In the ED, he received Solu-Medrol 125 mg IV once, 2 rounds of continuous albuterol times one hour. He remained stable on room air, but felt considerably short of breath compared to his baseline and even at rest. He was admitted for asthma exacerbation.   Active Problems:  Asthma exacerbation  - pt with more wheezing on exam this AM - continue current medical regimen with BD's scheduled and as needed  - increase the dose of Solumedrol and frequency as well and taper down as clinically indicated  - continue empiric ABX  Hypokalemia  - from BD's, supplement as indicated  Cigarette smoker one half pack a day or less  - smoking cessation discussed in detail  Tachycardia  - possibly related to BD's  - no chest pain this AM   Consultants:  None  Procedures/Studies:  CXR 09/03/2013 No acute cardiopulmonary disease.  CXR 09/02/2013 No evidence of acute airspace disease. 2. 6 mm density overlying the right lung, ? nipple shadow. Antibiotics:  Zithromax 6/27 -->  Code Status: Full  Family Communication: Pt at bedside  Disposition Plan: Home when medically stable  HPI/Subjective: No events overnight.    Objective: Filed Vitals:   09/04/13 0630 09/04/13 0816 09/04/13 1228 09/04/13 1347  BP: 121/75   127/81  Pulse: 72   86  Temp: 98.2 F (36.8 C)   98.2 F (36.8 C)  TempSrc: Oral     Resp: 18   16  Height:      Weight:      SpO2: 99% 99% 99% 95%    Intake/Output Summary (Last 24 hours) at 09/04/13 1440 Last data filed at 09/04/13 0600  Gross per 24 hour  Intake    720 ml  Output      0 ml  Net    720 ml    Exam:   General:  Pt is alert, follows commands appropriately, not in acute distress  Cardiovascular: Regular rate and rhythm, S1/S2, no murmurs, no rubs, no gallops  Respiratory: Course breath sounds bilaterally with inspiratory and expiratory wheezing   Abdomen: Soft, non tender, non distended, bowel sounds present, no guarding  Extremities: No edema, pulses DP and PT palpable bilaterally  Neuro: Grossly nonfocal  Data Reviewed: Basic Metabolic Panel:  Recent Labs Lab 09/02/13 2105 09/04/13 0532  NA 141 136*  K 3.1* 4.4  CL 100 99  CO2 22 24  GLUCOSE 261* 136*  BUN 11 12  CREATININE 1.00 0.94  CALCIUM 9.4 9.9   CBC:  Recent Labs Lab 09/02/13 2105 09/04/13 0532  WBC 6.7 15.2*  HGB 13.2 14.0  HCT 37.8* 41.1  MCV 84.4 84.9  PLT 284 325   CBG:  Recent Labs Lab 09/03/13 1136 09/03/13 1811 09/03/13  2123 09/04/13 0719 09/04/13 1205  GLUCAP 125* 136* 152* 129* 119*    Scheduled Meds: . arformoterol  15 mcg Nebulization BID  . azithromycin  250 mg Oral Daily  . budesonide (PULMICORT) nebulizer solution  0.5 mg Nebulization BID  . enoxaparin (LOVENOX) injection  50 mg Subcutaneous Q24H  . insulin aspart  0-5 Units Subcutaneous QHS  . insulin aspart  0-9 Units Subcutaneous TID WC  . ipratropium-albuterol  3 mL Nebulization Q4H  . methylPREDNISolone (SOLU-MEDROL) injection  60 mg Intravenous Q4H   Continuous Infusions:    Debbora PrestoMAGICK-MYERS, ISKRA, MD  [facility] Pager [phone]  If 7PM-7AM, please contact  night-coverage [email or web address] Password [email or web address] 09/04/2013, 2:40 PM   LOS: 2 days

## 2013-09-05 ENCOUNTER — Inpatient Hospital Stay (HOSPITAL_COMMUNITY): Payer: No Typology Code available for payment source

## 2013-09-05 LAB — BASIC METABOLIC PANEL
BUN: 14 mg/dL (ref 6–23)
CALCIUM: 9.8 mg/dL (ref 8.4–10.5)
CO2: 23 meq/L (ref 19–32)
Chloride: 98 mEq/L (ref 96–112)
Creatinine, Ser: 0.89 mg/dL (ref 0.50–1.35)
GFR calc Af Amer: >90 mL/min (ref >90)
GLUCOSE: 200 mg/dL — AB (ref 70–99)
Potassium: 4.7 mEq/L (ref 3.7–5.3)
SODIUM: 135 meq/L — AB (ref 137–147)

## 2013-09-05 LAB — CBC
HEMATOCRIT: 41.6 % (ref 39.0–52.0)
HEMOGLOBIN: 14.1 g/dL (ref 13.0–17.0)
MCH: 28.9 pg (ref 26.0–34.0)
MCHC: 33.9 g/dL (ref 30.0–36.0)
MCV: 85.2 fL (ref 78.0–100.0)
Platelets: 343 10*3/uL (ref 150–400)
RBC: 4.88 MIL/uL (ref 4.22–5.81)
RDW: 13.6 % (ref 11.5–15.5)
WBC: 12.2 10*3/uL — ABNORMAL HIGH (ref 4.0–10.5)

## 2013-09-05 LAB — GLUCOSE, CAPILLARY
GLUCOSE-CAPILLARY: 200 mg/dL — AB (ref 70–99)
GLUCOSE-CAPILLARY: 169 mg/dL — AB (ref 70–99)
Glucose-Capillary: 218 mg/dL — ABNORMAL HIGH (ref 70–99)
Glucose-Capillary: 199 mg/dL — ABNORMAL HIGH (ref 70–99)

## 2013-09-05 NOTE — Progress Notes (Signed)
RN called report to Myra, RN on 4th floor.   All questions answered.   Patient transferred in wheelchair with NT to [phone].

## 2013-09-05 NOTE — Progress Notes (Signed)
Patient ID: Luis Hitcheginald A Wysong Jr., male   DOB: 11/22/1965, 48 y.o.   MRN: [id number]  [facility] PROGRESS NOTE  Luis Hitcheginald A Walston Jr. [id number] DOB: 11/22/1965 DOA: 09/02/2013 PCP: Default, Provider, MD  Brief narrative:  48 y.o. year-old male with history of asthma since birth who presented with wheezing, shortness of breath, increased cough x 1 day. At baseline, the patient is not on any controller medications, however he has daytime and nighttime cough on a daily basis. He uses is albuterol inhaler at least 2-3 times every day. He has on average 2-3 exacerbations per year and has been hospitalized previously. This exacerbation feels less severe than some of his previous exacerbations. He denies any recent fevers or chills, productive cough, sinus congestion.   In the ED, he received Solu-Medrol 125 mg IV once, 2 rounds of continuous albuterol times one hour. He remained stable on room air, but felt considerably short of breath compared to his baseline and even at rest. He was admitted for asthma exacerbation.   Active Problems:  Asthma exacerbation  - pt still wheezing on exam this AM  - continue current medical regimen with BD's scheduled and as needed, solumedrol - will not taper down solumedrol yet due to persistent wheezing - continue empiric ABX  Hypokalemia  - from BD's, supplemented and WNL this AM Cigarette smoker one half pack a day or less  - smoking cessation discussed in detail  Tachycardia  - possibly related to BD's  - resolved  - no chest pain this AM   Consultants:  None  Procedures/Studies:  CXR 09/03/2013 No acute cardiopulmonary disease.  CXR 09/02/2013 No evidence of acute airspace disease. 2. 6 mm density overlying the right lung, ? nipple shadow. Antibiotics:  Zithromax 6/27 -->  Code Status: Full  Family Communication: Pt at bedside  Disposition Plan: Home when medically stable, possibly in 24 - 48 hours    HPI/Subjective: No events  overnight.   Objective: Filed Vitals:   09/05/13 0426 09/05/13 0500 09/05/13 0925 09/05/13 0945  BP:  128/80 137/78   Pulse:  93 90   Temp:  98.4 F (36.9 C) 98 F (36.7 C)   TempSrc:  Oral Oral   Resp:  18 18   Height:   5\' 9"  (1.753 m)   Weight:   104.327 kg (230 lb)   SpO2: 95% 96% 99% 96%    Intake/Output Summary (Last 24 hours) at 09/05/13 1141 Last data filed at 09/04/13 1300  Gross per 24 hour  Intake    240 ml  Output      0 ml  Net    240 ml    Exam:   General:  Pt is alert, follows commands appropriately, not in acute distress  Cardiovascular: Regular rate and rhythm, S1/S2, no murmurs, no rubs, no gallops  Respiratory: Clear to auscultation bilaterally, expiratory wheezing still present and diminished breath sounds at bases   Abdomen: Soft, non tender, non distended, bowel sounds present, no guarding  Extremities: No edema, pulses DP and PT palpable bilaterally  Neuro: Grossly nonfocal  Data Reviewed: Basic Metabolic Panel:  Recent Labs Lab 09/02/13 2105 09/04/13 0532 09/05/13 0453  NA 141 136* 135*  K 3.1* 4.4 4.7  CL 100 99 98  CO2 22 24 23   GLUCOSE 261* 136* 200*  BUN 11 12 14   CREATININE 1.00 0.94 0.89  CALCIUM 9.4 9.9 9.8   Liver Function Tests: No results found for this basename: AST, ALT, ALKPHOS, BILITOT, PROT,  ALBUMIN,  in the last 168 hours No results found for this basename: LIPASE, AMYLASE,  in the last 168 hours No results found for this basename: AMMONIA,  in the last 168 hours CBC:  Recent Labs Lab 09/02/13 2105 09/04/13 0532 09/05/13 0453  WBC 6.7 15.2* 12.2*  HGB 13.2 14.0 14.1  HCT 37.8* 41.1 41.6  MCV 84.4 84.9 85.2  PLT 284 325 343   Cardiac Enzymes: No results found for this basename: CKTOTAL, CKMB, CKMBINDEX, TROPONINI,  in the last 168 hours BNP: No components found with this basename: POCBNP,  CBG:  Recent Labs Lab 09/04/13 1205 09/04/13 1752 09/04/13 2125 09/05/13 0749 09/05/13 1125  GLUCAP 119*  149* 215* 200* 169*    No results found for this or any previous visit (from the past 240 hour(s)).   Scheduled Meds: . arformoterol  15 mcg Nebulization BID  . azithromycin  250 mg Oral Daily  . budesonide (PULMICORT) nebulizer solution  0.5 mg Nebulization BID  . enoxaparin (LOVENOX) injection  50 mg Subcutaneous Q24H  . insulin aspart  0-5 Units Subcutaneous QHS  . insulin aspart  0-9 Units Subcutaneous TID WC  . ipratropium-albuterol  3 mL Nebulization Q4H  . methylPREDNISolone (SOLU-MEDROL) injection  60 mg Intravenous Q4H   Continuous Infusions:   Debbora PrestoMAGICK-Shirline Kendle, MD  [facility] Pager [phone]  If 7PM-7AM, please contact night-coverage [email or web address] Password [email or web address] 09/05/2013, 11:41 AM   LOS: 3 days

## 2013-09-06 LAB — BASIC METABOLIC PANEL
BUN: 17 mg/dL (ref 6–23)
CO2: 26 meq/L (ref 19–32)
CREATININE: 0.99 mg/dL (ref 0.50–1.35)
Calcium: 9.8 mg/dL (ref 8.4–10.5)
Chloride: 93 mEq/L — ABNORMAL LOW (ref 96–112)
GFR calc non Af Amer: >90 mL/min (ref >90)
Glucose, Bld: 256 mg/dL — ABNORMAL HIGH (ref 70–99)
Potassium: 4.8 mEq/L (ref 3.7–5.3)
SODIUM: 131 meq/L — AB (ref 137–147)

## 2013-09-06 LAB — CBC
HEMATOCRIT: 40.8 % (ref 39.0–52.0)
Hemoglobin: 13.9 g/dL (ref 13.0–17.0)
MCH: 28.8 pg (ref 26.0–34.0)
MCHC: 34.1 g/dL (ref 30.0–36.0)
MCV: 84.6 fL (ref 78.0–100.0)
Platelets: 355 10*3/uL (ref 150–400)
RBC: 4.82 MIL/uL (ref 4.22–5.81)
RDW: 13.6 % (ref 11.5–15.5)
WBC: 12.3 10*3/uL — AB (ref 4.0–10.5)

## 2013-09-06 LAB — GLUCOSE, CAPILLARY: GLUCOSE-CAPILLARY: 217 mg/dL — AB (ref 70–99)

## 2013-09-06 MED ORDER — ALBUTEROL SULFATE HFA 108 (90 BASE) MCG/ACT IN AERS
2.0000 | INHALATION_SPRAY | RESPIRATORY_TRACT | Status: DC | PRN
  Filled 2013-09-06: qty 6.7

## 2013-09-06 MED ORDER — PREDNISONE 20 MG PO TABS: 20.0000 mg | ORAL_TABLET | Freq: Every day | ORAL | Status: DC

## 2013-09-06 MED ORDER — ALBUTEROL SULFATE HFA 108 (90 BASE) MCG/ACT IN AERS: 2.0000 | INHALATION_SPRAY | Freq: Four times a day (QID) | RESPIRATORY_TRACT | Status: DC | PRN

## 2013-09-06 MED ORDER — FLUTICASONE PROPIONATE HFA 110 MCG/ACT IN AERO
1.0000 | INHALATION_SPRAY | Freq: Two times a day (BID) | RESPIRATORY_TRACT | Status: DC
  Filled 2013-09-06: qty 12

## 2013-09-06 MED ORDER — FLUTICASONE PROPIONATE HFA 110 MCG/ACT IN AERO: 1.0000 | INHALATION_SPRAY | Freq: Two times a day (BID) | RESPIRATORY_TRACT | Status: DC

## 2013-09-06 NOTE — Progress Notes (Signed)
Utilization review completed.  

## 2013-09-06 NOTE — Discharge Instructions (Signed)
You were cared for by a hospitalist during your hospital stay. If you have any questions about your discharge medications or the care you received while you were in the hospital after you are discharged, you can call the unit and asked to speak with the hospitalist on call if the hospitalist that took care of you is not available. Once you are discharged, your primary care physician will handle any further medical issues. Please note that NO REFILLS for any discharge medications will be authorized once you are discharged, as it is imperative that you return to your primary care physician (or establish a relationship with a primary care physician if you do not have one) for your aftercare needs so that they can reassess your need for medications and monitor your lab values. °  °  °If you do not have a primary care physician, you can call [phone] for a physician referral. ° °Follow with Primary MD in 5-7 days  ° °Get CBC, CMP checked by your doctor and again as further instructed.  °Get a 2 view Chest X ray done next visit if you had Pneumonia of Lung problems at the Hospital. ° °Get Medicines reviewed and adjusted. ° °Please request your Prim.MD to go over all Hospital Tests and Procedure/Radiological results at the follow up, please get all Hospital records sent to your Prim MD by signing hospital release before you go home. ° °Activity: As tolerated with Full fall precautions use walker/cane & assistance as needed ° °Diet: regular ° °For Heart failure patients - Check your Weight same time everyday, if you gain over 2 pounds, or you develop in leg swelling, experience more shortness of breath or chest pain, call your Primary MD immediately. Follow Cardiac Low Salt Diet and 1.8 lit/day fluid restriction. ° °Disposition Home ° °If you experience worsening of your admission symptoms, develop shortness of breath, life threatening emergency, suicidal or homicidal thoughts you must seek medical attention immediately by  calling 911 or calling your MD immediately  if symptoms less severe. ° °You Must read complete instructions/literature along with all the possible adverse reactions/side effects for all the Medicines you take and that have been prescribed to you. Take any new Medicines after you have completely understood and accpet all the possible adverse reactions/side effects.  ° °Do not drive and provide baby sitting services if your were admitted for syncope or siezures until you have seen by Primary MD or a Neurologist and advised to do so again. ° °Do not drive when taking Pain medications.  ° °Do not take more than prescribed Pain, Sleep and Anxiety Medications ° °Special Instructions: If you have smoked or chewed Tobacco  in the last 2 yrs please stop smoking, stop any regular Alcohol  and or any Recreational drug use. ° °Wear Seat belts while driving. ° °

## 2013-09-06 NOTE — Discharge Summary (Signed)
Physician Discharge Summary  Sena Hitcheginald A Sui Jr. ZOX:[id number] DOB: 07-04-1965 DOA: 09/02/2013  PCP: Default, Provider, MD  Admit date: 09/02/2013 Discharge date: 09/06/2013  Time spent: 35 minutes  Recommendations for Outpatient Follow-up:  1. Follow up with PCP in 1 week   Discharge Diagnoses:  Active Problems:   Asthma exacerbation   Cigarette smoker one half pack a day or less   Tachycardia  Discharge Condition: stable  Diet recommendation: regular   Filed Weights   09/02/13 2300 09/05/13 0925  Weight: 104.327 kg (230 lb) 104.327 kg (230 lb)   History of present illness:  48 y.o. year-old male with history of asthma since birth who presented with wheezing, shortness of breath, increased cough x 1 day. At baseline, the patient is not on any controller medications, however he has daytime and nighttime cough on a daily basis. He uses is albuterol inhaler at least 2-3 times every day. He has on average 2-3 exacerbations per year and has been hospitalized previously. This exacerbation feels less severe than some of his previous exacerbations. He denies any recent fevers or chills, productive cough, sinus congestion. In the ED, he received Solu-Medrol 125 mg IV once, 2 rounds of continuous albuterol times one hour. He remained stable on room air, but felt considerably short of breath compared to his baseline and even at rest. He was admitted for asthma exacerbation.   Hospital Course:  Asthma exacerbation - patient admitted and started on medical regimen with BD's scheduled and as needed, solumedrol. He was slow to improve but by 7/1 am patient had no wheezing, was feeling back to normal, able to ambulate without any dyspnea or chest pain and asking to go home. He completed 5 days of azithromycin while hospitalized. He was discharged home with a prednisone taper and I have added an inhaled steroid to his regimen.  Hypokalemia - from BD's, supplemented and WNL  Cigarette smoker one  half pack a day or less - smoking cessation discussed in detail  Tachycardia - possibly related to BD's, resolved   Procedures:  None    Consultations:  None   Discharge Exam: Filed Vitals:   09/05/13 2313 09/06/13 0308 09/06/13 0524 09/06/13 0742  BP:   116/66   Pulse:   92   Temp:   97.6 F (36.4 C)   TempSrc:   Oral   Resp:   16   Height:      Weight:      SpO2: 97% 98% 98% 97%   General: NAD Cardiovascular: RRR Respiratory: CTA biL  Discharge Instructions    Medication List         albuterol 108 (90 BASE) MCG/ACT inhaler  Commonly known as:  PROVENTIL HFA;VENTOLIN HFA  Inhale 2 puffs into the lungs every 4 (four) hours as needed for wheezing.     albuterol 108 (90 BASE) MCG/ACT inhaler  Commonly known as:  PROVENTIL HFA;VENTOLIN HFA  Inhale 2 puffs into the lungs every 6 (six) hours as needed. For shortness of breath     fluticasone 110 MCG/ACT inhaler  Commonly known as:  FLOVENT HFA  Inhale 1 puff into the lungs 2 (two) times daily.     predniSONE 20 MG tablet  Commonly known as:  DELTASONE  Take 1 tablet (20 mg total) by mouth daily with breakfast. Take 2 pills daily for 2 days then 1 pill daily for 3 days then half a pill until finished.       The results of significant  diagnostics from this hospitalization (including imaging, microbiology, ancillary and laboratory) are listed below for reference.    Significant Diagnostic Studies: Dg Chest 2 View  09/05/2013   CLINICAL DATA:  Persistent wheezing and cough.  EXAM: CHEST  2 VIEW  COMPARISON:  September 03, 2013.  FINDINGS: The heart size and mediastinal contours are within normal limits. Both lungs are clear. No pneumothorax or pleural effusion is noted. The visualized skeletal structures are unremarkable.  IMPRESSION: No acute cardiopulmonary abnormality seen.   Electronically Signed   By: Roque LiasJames  Green M.D.   On: 09/05/2013 11:58   Dg Chest 2 View  09/03/2013   CLINICAL DATA:  please place nipple marker.  possible nipple shadow on previous CXR please place nipple marker. possible nipple shadow on previous CXR  EXAM: CHEST - 2 VIEW  COMPARISON:  the previous day's study  FINDINGS: The nipple marker corresponds to the site of the previously identified nodular density, which itself is not seen on the current exam. Lungs are clear. Heart size normal. No effusion. Spurring in the lower thoracic spine.  IMPRESSION: No acute cardiopulmonary disease.   Electronically Signed   By: Oley Balmaniel  Hassell M.D.   On: 09/03/2013 07:41   Dg Chest 2 View  09/02/2013   CLINICAL DATA:  Chest pain, cough, congestion, shortness of breath.  EXAM: CHEST  2 VIEW  COMPARISON:  10/19/2011  FINDINGS: Cardiomediastinal silhouette is within normal limits. The lungs are well inflated. There is a 7 mm round density projecting over the right mid to lower lung on the PA image, not clearly seen on the prior study. The lungs are otherwise clear. No pleural effusion or pneumothorax is identified. No acute osseous abnormality is seen.  IMPRESSION: 1. No evidence of acute airspace disease. 2. 6 mm density overlying the right lung, possibly a nipple shadow. Consider repeat radiograph with nipple markers.   Electronically Signed   By: Sebastian AcheAllen  Grady   On: 09/02/2013 19:19   Labs: Basic Metabolic Panel:  Recent Labs Lab 09/02/13 2105 09/04/13 0532 09/05/13 0453 09/05/13 2349  NA 141 136* 135* 131*  K 3.1* 4.4 4.7 4.8  CL 100 99 98 93*  CO2 22 24 23 26   GLUCOSE 261* 136* 200* 256*  BUN 11 12 14 17   CREATININE 1.00 0.94 0.89 0.99  CALCIUM 9.4 9.9 9.8 9.8   CBC:  Recent Labs Lab 09/02/13 2105 09/04/13 0532 09/05/13 0453 09/05/13 2349  WBC 6.7 15.2* 12.2* 12.3*  HGB 13.2 14.0 14.1 13.9  HCT 37.8* 41.1 41.6 40.8  MCV 84.4 84.9 85.2 84.6  PLT 284 325 343 355   CBG:  Recent Labs Lab 09/05/13 0749 09/05/13 1125 09/05/13 1708 09/05/13 2127 09/06/13 0744  GLUCAP 200* 169* 218* 199* 217*   Signed:  GHERGHE, COSTIN  [facility] 09/06/2013, 10:13 AM

## 2015-04-09 ENCOUNTER — Encounter (HOSPITAL_COMMUNITY): Payer: Self-pay

## 2015-04-09 ENCOUNTER — Inpatient Hospital Stay (HOSPITAL_COMMUNITY)
Admission: EM | Admit: 2015-04-09 | Discharge: 2015-04-13 | DRG: 203 | Disposition: A | Discharge status: Home / Self Care | Payer: Self-pay | Attending: Internal Medicine | Admitting: Internal Medicine

## 2015-04-09 ENCOUNTER — Emergency Department (HOSPITAL_COMMUNITY): Payer: Self-pay

## 2015-04-09 DIAGNOSIS — R000 Tachycardia, unspecified: Secondary | ICD-10-CM | POA: Diagnosis present

## 2015-04-09 DIAGNOSIS — F1010 Alcohol abuse, uncomplicated: Secondary | ICD-10-CM | POA: Diagnosis present

## 2015-04-09 DIAGNOSIS — R0781 Pleurodynia: Secondary | ICD-10-CM | POA: Diagnosis present

## 2015-04-09 DIAGNOSIS — Z79899 Other long term (current) drug therapy: Secondary | ICD-10-CM

## 2015-04-09 DIAGNOSIS — F17210 Nicotine dependence, cigarettes, uncomplicated: Secondary | ICD-10-CM | POA: Diagnosis present

## 2015-04-09 DIAGNOSIS — Z825 Family history of asthma and other chronic lower respiratory diseases: Secondary | ICD-10-CM

## 2015-04-09 DIAGNOSIS — J45901 Unspecified asthma with (acute) exacerbation: Principal | ICD-10-CM

## 2015-04-09 LAB — BASIC METABOLIC PANEL
Anion gap: 9 (ref 5–15)
BUN: 14 mg/dL (ref 6–20)
CALCIUM: 9.8 mg/dL (ref 8.9–10.3)
CO2: 25 mmol/L (ref 22–32)
CREATININE: 1.23 mg/dL (ref 0.61–1.24)
Chloride: 104 mmol/L (ref 101–111)
Glucose, Bld: 113 mg/dL — ABNORMAL HIGH (ref 65–99)
Potassium: 3.7 mmol/L (ref 3.5–5.1)
SODIUM: 138 mmol/L (ref 135–145)

## 2015-04-09 LAB — CBC
HCT: 39.7 % (ref 39.0–52.0)
Hemoglobin: 13.3 g/dL (ref 13.0–17.0)
MCH: 29 pg (ref 26.0–34.0)
MCHC: 33.5 g/dL (ref 30.0–36.0)
MCV: 86.7 fL (ref 78.0–100.0)
PLATELETS: 251 10*3/uL (ref 150–400)
RBC: 4.58 MIL/uL (ref 4.22–5.81)
RDW: 12.7 % (ref 11.5–15.5)
WBC: 8.7 10*3/uL (ref 4.0–10.5)

## 2015-04-09 LAB — I-STAT TROPONIN, ED: TROPONIN I, POC: 0 ng/mL (ref 0.00–0.08)

## 2015-04-09 MED ORDER — DEXAMETHASONE SODIUM PHOSPHATE 10 MG/ML IJ SOLN
10.0000 mg | Freq: Once | INTRAMUSCULAR | Status: AC
  Administered 2015-04-09: 10 mg via INTRAVENOUS
  Filled 2015-04-09: qty 1

## 2015-04-09 MED ORDER — ALBUTEROL SULFATE (2.5 MG/3ML) 0.083% IN NEBU
5.0000 mg | INHALATION_SOLUTION | Freq: Once | RESPIRATORY_TRACT | Status: AC
Start: 2015-04-09 — End: 2015-04-09
  Administered 2015-04-09: 5 mg via RESPIRATORY_TRACT

## 2015-04-09 MED ORDER — ALBUTEROL SULFATE (2.5 MG/3ML) 0.083% IN NEBU
INHALATION_SOLUTION | RESPIRATORY_TRACT | Status: AC
  Filled 2015-04-09: qty 6

## 2015-04-09 MED ORDER — ALBUTEROL (5 MG/ML) CONTINUOUS INHALATION SOLN
10.0000 mg/h | INHALATION_SOLUTION | Freq: Once | RESPIRATORY_TRACT | Status: AC
  Administered 2015-04-09: 10 mg/h via RESPIRATORY_TRACT
  Filled 2015-04-09: qty 20

## 2015-04-09 NOTE — ED Notes (Signed)
Pt has asthma and has had a cough and runny nose. Has used his inhaler multiple times today and is now out of it. Doesn't feel like it's helping. Has chest soreness when coughing

## 2015-04-10 ENCOUNTER — Encounter (HOSPITAL_COMMUNITY): Payer: Self-pay | Admitting: Internal Medicine

## 2015-04-10 DIAGNOSIS — J45901 Unspecified asthma with (acute) exacerbation: Principal | ICD-10-CM

## 2015-04-10 DIAGNOSIS — R0781 Pleurodynia: Secondary | ICD-10-CM | POA: Diagnosis present

## 2015-04-10 LAB — TROPONIN I
TROPONIN I: 0.03 ng/mL (ref <0.031)
TROPONIN I: 0.03 ng/mL (ref <0.031)
Troponin I: <0.03 ng/mL (ref <0.031)

## 2015-04-10 LAB — BASIC METABOLIC PANEL
ANION GAP: 12 (ref 5–15)
BUN: 11 mg/dL (ref 6–20)
CALCIUM: 9.7 mg/dL (ref 8.9–10.3)
CO2: 21 mmol/L — AB (ref 22–32)
CREATININE: 1.19 mg/dL (ref 0.61–1.24)
Chloride: 103 mmol/L (ref 101–111)
GFR calc Af Amer: >60 mL/min (ref >60)
GLUCOSE: 159 mg/dL — AB (ref 65–99)
Potassium: 4.3 mmol/L (ref 3.5–5.1)
Sodium: 136 mmol/L (ref 135–145)

## 2015-04-10 LAB — CBC WITH DIFFERENTIAL/PLATELET
BASOS ABS: 0 10*3/uL (ref 0.0–0.1)
BASOS PCT: 0 %
EOS ABS: 0 10*3/uL (ref 0.0–0.7)
EOS PCT: 0 %
HEMATOCRIT: 37.6 % — AB (ref 39.0–52.0)
Hemoglobin: 13 g/dL (ref 13.0–17.0)
Lymphocytes Relative: 5 %
Lymphs Abs: 0.4 10*3/uL — ABNORMAL LOW (ref 0.7–4.0)
MCH: 29.9 pg (ref 26.0–34.0)
MCHC: 34.6 g/dL (ref 30.0–36.0)
MCV: 86.4 fL (ref 78.0–100.0)
MONO ABS: 0.1 10*3/uL (ref 0.1–1.0)
MONOS PCT: 1 %
NEUTROS ABS: 7.8 10*3/uL — AB (ref 1.7–7.7)
Neutrophils Relative %: 94 %
PLATELETS: 249 10*3/uL (ref 150–400)
RBC: 4.35 MIL/uL (ref 4.22–5.81)
RDW: 13 % (ref 11.5–15.5)
WBC: 8.3 10*3/uL (ref 4.0–10.5)

## 2015-04-10 LAB — D-DIMER, QUANTITATIVE (NOT AT ARMC): D DIMER QUANT: 0.35 ug{FEU}/mL (ref 0.00–0.50)

## 2015-04-10 MED ORDER — DEXTROSE 5 % IV SOLN
500.0000 mg | INTRAVENOUS | Status: DC
  Administered 2015-04-10 – 2015-04-11 (×2): 500 mg via INTRAVENOUS
  Filled 2015-04-10 (×2): qty 500

## 2015-04-10 MED ORDER — ALUM & MAG HYDROXIDE-SIMETH 200-200-20 MG/5ML PO SUSP
30.0000 mL | Freq: Four times a day (QID) | ORAL | Status: DC | PRN
  Administered 2015-04-10 – 2015-04-12 (×6): 30 mL via ORAL
  Filled 2015-04-10 (×6): qty 30

## 2015-04-10 MED ORDER — ACETAMINOPHEN 325 MG PO TABS
650.0000 mg | ORAL_TABLET | Freq: Four times a day (QID) | ORAL | Status: DC | PRN
  Administered 2015-04-10: 650 mg via ORAL
  Filled 2015-04-10: qty 2

## 2015-04-10 MED ORDER — KETOROLAC TROMETHAMINE 30 MG/ML IJ SOLN
30.0000 mg | Freq: Once | INTRAMUSCULAR | Status: AC
  Administered 2015-04-10: 30 mg via INTRAVENOUS
  Filled 2015-04-10: qty 1

## 2015-04-10 MED ORDER — ADULT MULTIVITAMIN W/MINERALS CH
1.0000 | ORAL_TABLET | Freq: Every day | ORAL | Status: DC
  Administered 2015-04-10 – 2015-04-13 (×4): 1 via ORAL
  Filled 2015-04-10 (×4): qty 1

## 2015-04-10 MED ORDER — VITAMIN B-1 100 MG PO TABS
100.0000 mg | ORAL_TABLET | Freq: Every day | ORAL | Status: DC
  Administered 2015-04-10 – 2015-04-13 (×4): 100 mg via ORAL
  Filled 2015-04-10 (×4): qty 1

## 2015-04-10 MED ORDER — IPRATROPIUM-ALBUTEROL 0.5-2.5 (3) MG/3ML IN SOLN
3.0000 mL | Freq: Four times a day (QID) | RESPIRATORY_TRACT | Status: DC
  Administered 2015-04-10 – 2015-04-11 (×4): 3 mL via RESPIRATORY_TRACT
  Filled 2015-04-10 (×4): qty 3

## 2015-04-10 MED ORDER — MAGNESIUM SULFATE 2 GM/50ML IV SOLN
2.0000 g | Freq: Once | INTRAVENOUS | Status: AC
  Administered 2015-04-10: 2 g via INTRAVENOUS
  Filled 2015-04-10: qty 50

## 2015-04-10 MED ORDER — LORAZEPAM 2 MG/ML IJ SOLN: 1.0000 mg | Freq: Four times a day (QID) | INTRAMUSCULAR | Status: AC | PRN

## 2015-04-10 MED ORDER — THIAMINE HCL 100 MG/ML IJ SOLN: 100.0000 mg | Freq: Every day | INTRAMUSCULAR | Status: DC

## 2015-04-10 MED ORDER — FOLIC ACID 1 MG PO TABS
1.0000 mg | ORAL_TABLET | Freq: Every day | ORAL | Status: DC
  Administered 2015-04-10 – 2015-04-13 (×4): 1 mg via ORAL
  Filled 2015-04-10 (×4): qty 1

## 2015-04-10 MED ORDER — ALBUTEROL SULFATE (2.5 MG/3ML) 0.083% IN NEBU: 2.5000 mg | INHALATION_SOLUTION | Freq: Two times a day (BID) | RESPIRATORY_TRACT | Status: DC

## 2015-04-10 MED ORDER — ENOXAPARIN SODIUM 40 MG/0.4ML ~~LOC~~ SOLN
40.0000 mg | SUBCUTANEOUS | Status: DC
  Administered 2015-04-10 – 2015-04-13 (×4): 40 mg via SUBCUTANEOUS
  Filled 2015-04-10 (×3): qty 0.4

## 2015-04-10 MED ORDER — METHYLPREDNISOLONE SODIUM SUCC 40 MG IJ SOLR
40.0000 mg | Freq: Two times a day (BID) | INTRAMUSCULAR | Status: DC
  Administered 2015-04-10: 40 mg via INTRAVENOUS
  Filled 2015-04-10: qty 1

## 2015-04-10 MED ORDER — ONDANSETRON HCL 4 MG PO TABS: 4.0000 mg | ORAL_TABLET | Freq: Four times a day (QID) | ORAL | Status: DC | PRN

## 2015-04-10 MED ORDER — BUDESONIDE 0.25 MG/2ML IN SUSP
0.2500 mg | Freq: Two times a day (BID) | RESPIRATORY_TRACT | Status: DC
  Administered 2015-04-10 – 2015-04-13 (×7): 0.25 mg via RESPIRATORY_TRACT
  Filled 2015-04-10 (×7): qty 2

## 2015-04-10 MED ORDER — ALBUTEROL SULFATE (2.5 MG/3ML) 0.083% IN NEBU
2.5000 mg | INHALATION_SOLUTION | RESPIRATORY_TRACT | Status: DC
  Administered 2015-04-10: 2.5 mg via RESPIRATORY_TRACT
  Filled 2015-04-10: qty 3

## 2015-04-10 MED ORDER — METHYLPREDNISOLONE SODIUM SUCC 125 MG IJ SOLR
60.0000 mg | Freq: Three times a day (TID) | INTRAMUSCULAR | Status: DC
  Administered 2015-04-10 – 2015-04-13 (×9): 60 mg via INTRAVENOUS
  Filled 2015-04-10 (×9): qty 2

## 2015-04-10 MED ORDER — PANTOPRAZOLE SODIUM 40 MG PO TBEC
40.0000 mg | DELAYED_RELEASE_TABLET | Freq: Every day | ORAL | Status: DC
  Administered 2015-04-10 – 2015-04-13 (×4): 40 mg via ORAL
  Filled 2015-04-10 (×4): qty 1

## 2015-04-10 MED ORDER — ACETAMINOPHEN 650 MG RE SUPP: 650.0000 mg | Freq: Four times a day (QID) | RECTAL | Status: DC | PRN

## 2015-04-10 MED ORDER — LORAZEPAM 1 MG PO TABS: 1.0000 mg | ORAL_TABLET | Freq: Four times a day (QID) | ORAL | Status: AC | PRN

## 2015-04-10 MED ORDER — ONDANSETRON HCL 4 MG/2ML IJ SOLN: 4.0000 mg | Freq: Four times a day (QID) | INTRAMUSCULAR | Status: DC | PRN

## 2015-04-10 MED ORDER — ALBUTEROL SULFATE (2.5 MG/3ML) 0.083% IN NEBU: 2.5000 mg | INHALATION_SOLUTION | RESPIRATORY_TRACT | Status: DC | PRN

## 2015-04-10 NOTE — Progress Notes (Addendum)
Patient received from ED. Oriented to room, call light within reach. Tele box verification completed, physician paged per order.

## 2015-04-10 NOTE — H&P (Signed)
[facility] History and Physical  [name]. ZOX:[id number] DOB: 1966/03/06 DOA: 04/09/2015  Referring physician: Dr. Elesa Massed. PCP: Valera Castle, MD  Specialists: None.  Chief Complaint: Shortness of breath.  HPI: Luis Gay. is a 50 y.o. male with history of asthma and ongoing tobacco abuse presents to the ER because of increasing shortness of breath over the last 2 days. Patient has been having some nonproductive cough. And also has been having pleuritic type of chest pain. In the ER patient was found to have bilateral expiratory wheeze and was given multiple doses of nebulizer despite which patient is still short of breath and will be admitted for asthma exacerbation. Patient's chest pain is only on deep inspiration. Has been having some non-productive cough. Denies any fever chills or sick contacts.   Review of Systems: As presented in the history of presenting illness, rest negative.  Past Medical History  Diagnosis Date  . Asthma   . Tobacco use    History reviewed. No pertinent past surgical history. Social History:  reports that he has been smoking Cigarettes.  He has a 7.5 pack-year smoking history. He does not have any smokeless tobacco history on file. He reports that he drinks about 7.2 oz of alcohol per week. He reports that he uses illicit drugs (Marijuana). Where does patient live home. Can patient participate in ADLs? Yes.  No Known Allergies  Family History:  Family History  Problem Relation Age of Onset  . Asthma Father   . Diabetes    . High blood pressure        Prior to Admission medications   Medication Sig Start Date End Date Taking? Authorizing Provider  albuterol (PROVENTIL HFA;VENTOLIN HFA) 108 (90 BASE) MCG/ACT inhaler Inhale 2 puffs into the lungs every 4 (four) hours as needed for wheezing. 10/19/11 04/09/15 Yes Mancel Bale, MD  albuterol (PROVENTIL HFA;VENTOLIN HFA) 108 (90 BASE) MCG/ACT inhaler Inhale 2 puffs into the  lungs every 6 (six) hours as needed. For shortness of breath Patient not taking: Reported on 04/09/2015 09/06/13   Leatha Gilding, MD  fluticasone (FLOVENT HFA) 110 MCG/ACT inhaler Inhale 1 puff into the lungs 2 (two) times daily. Patient not taking: Reported on 04/09/2015 09/06/13   Leatha Gilding, MD  predniSONE (DELTASONE) 20 MG tablet Take 1 tablet (20 mg total) by mouth daily with breakfast. Take 2 pills daily for 2 days then 1 pill daily for 3 days then half a pill until finished. Patient not taking: Reported on 04/09/2015 09/06/13   Leatha Gilding, MD    Physical Exam: Filed Vitals:   04/10/15 0230 04/10/15 0400 04/10/15 0530 04/10/15 0603  BP: 117/84 104/82 116/71 130/70  Pulse: 99 91 92 92  Temp:    98 F (36.7 C)  TempSrc:    Oral  Resp: Weight:    104.645 kg (230 lb 11.2 oz)  SpO2: 97% 95% 96% 98%     General:  Moderately built and nourished.  Eyes: Anicteric no pallor.  ENT: No discharge from the ears eyes nose or mouth.  Neck: No mass felt. No JVD appreciated.  Cardiovascular: S1-S2 heard.  Respiratory: Bilateral expiratory wheeze heard no crepitations.  Abdomen: Soft nontender bowel sounds present.  Skin: No rash.  Musculoskeletal: No edema.  Psychiatric: Appears normal.  Neurologic: Alert awake oriented to time place and person. Moves all extremities.  Labs on Admission:  Basic Metabolic Panel:  Recent Labs Lab 04/09/15 2226  NA 138  K 3.7  CL 104  CO2 25  GLUCOSE 113*  BUN 14  CREATININE 1.23  CALCIUM 9.8   Liver Function Tests: No results for input(s): AST, ALT, ALKPHOS, BILITOT, PROT, ALBUMIN in the last 168 hours. No results for input(s): LIPASE, AMYLASE in the last 168 hours. No results for input(s): AMMONIA in the last 168 hours. CBC:  Recent Labs Lab 04/09/15 2226  WBC 8.7  HGB 13.3  HCT 39.7  MCV 86.7  PLT 251   Cardiac Enzymes: No results for input(s): CKTOTAL, CKMB, CKMBINDEX, TROPONINI in the last 168  hours.  BNP (last 3 results) No results for input(s): BNP in the last 8760 hours.  ProBNP (last 3 results) No results for input(s): PROBNP in the last 8760 hours.  CBG: No results for input(s): GLUCAP in the last 168 hours.  Radiological Exams on Admission: Dg Chest 2 View  04/09/2015  CLINICAL DATA:  Chest pain EXAM: CHEST  2 VIEW COMPARISON:  09/05/2013 FINDINGS: Normal heart size and mediastinal contours. No acute infiltrate or edema. No effusion or pneumothorax. No acute osseous findings. IMPRESSION: No active cardiopulmonary disease. Electronically Signed   By: Marnee Spring M.D.   On: 04/09/2015 23:37    EKG: Independently reviewed. Sinus tachycardia.  Assessment/Plan Principal Problem:   Asthma exacerbation Active Problems:   Cigarette smoker one half pack a day or less   Pleuritic chest pain   1. Asthma exacerbation - patient has been placed on IV Solu-Medrol Pulmicort nebulizer and Zithromax. Closely monitor in telemetry. 2. Pleuritic-type of chest pain - will check d-dimer and troponin. Chest pain only on deep inspiration. 3. Tobacco abuse - patient advised to quit smoking. 4. Alcohol abuse - patient is on CIWA protocol.   DVT Prophylaxis Lovenox.  Code Status: Full code.  Family Communication: Discussed with patient.  Disposition Plan: Admit to inpatient.    Luis Gay N. [facility] Pager [phone].  If 7PM-7AM, please contact night-coverage [email or web address] Password [email or web address] 04/10/2015, 6:15 AM

## 2015-04-10 NOTE — Progress Notes (Signed)
Utilization review completed.  

## 2015-04-10 NOTE — ED Notes (Signed)
MD at bedside. 

## 2015-04-10 NOTE — Progress Notes (Signed)
[facility] PROGRESS NOTE  Luis Gay. MVH:[id number] DOB: 02/04/1966 DOA: 04/09/2015 PCP: Valera Castle, MD  Assessment/Plan: Luis Coey. is a 50 y.o. male with history of asthma and ongoing tobacco abuse presents to the ER because of increasing shortness of breath over the last 2 days. Patient has been having some nonproductive cough. And also has been having pleuritic type of chest pain. In the ER patient was found to have bilateral expiratory wheeze and was given multiple doses of nebulizer despite which patient is still short of breath and will be admitted for asthma exacerbation. Patient's chest pain is only on deep inspiration. Has been having some non-productive cough. Denies any fever chills or sick contacts.    Asthma exacerbation - patient has been placed on IV Solu-Medrol Pulmicort nebulizer and Zithromax. Add schedule nebulizer, increase dose frequency of solumedrol.   Pleuritic-type of chest pain - D dimer negative, troponin negative. Has some EKG changes. Chest pain might be related to asthma. Will check ECHO.   Tobacco abuse - patient advised to quit smoking.  Alcohol abuse - patient is on CIWA protocol.  Code Status: Full code.  Family Communication: care discussed with patient.  Disposition Plan: remain inpatient for treatment of asthma exacerbation.    Consultants:  none  Procedures:  ECHO  Antibiotics:  Azithromycin   HPI/Subjective: Feeling better, breathing better, speaking full sentences.   Objective: Filed Vitals:   04/10/15 0530 04/10/15 0603  BP: 116/71 130/70  Pulse: 92 92  Temp:  98 F (36.7 C)  Resp:  18    Intake/Output Summary (Last 24 hours) at 04/10/15 1039 Last data filed at 04/10/15 0830  Gross per 24 hour  Intake    480 ml  Output      1 ml  Net    479 ml   Filed Weights   04/10/15 0603  Weight: 104.645 kg (230 lb 11.2 oz)    Exam:   General: NAD  Cardiovascular: S 1, S 2 RRR  Respiratory:  Bilateral wheezing  Abdomen: bs present, soft, nt  Musculoskeletal: no edema   Data Reviewed: Basic Metabolic Panel:  Recent Labs Lab 04/09/15 2226 04/10/15 0638  NA 138 136  K 3.7 4.3  CL 104 103  CO2 25 21*  GLUCOSE 113* 159*  BUN 14 11  CREATININE 1.23 1.19  CALCIUM 9.8 9.7   Liver Function Tests: No results for input(s): AST, ALT, ALKPHOS, BILITOT, PROT, ALBUMIN in the last 168 hours. No results for input(s): LIPASE, AMYLASE in the last 168 hours. No results for input(s): AMMONIA in the last 168 hours. CBC:  Recent Labs Lab 04/09/15 2226 04/10/15 0638  WBC 8.7 8.3  NEUTROABS  --  7.8*  HGB 13.3 13.0  HCT 39.7 37.6*  MCV 86.7 86.4  PLT 251 249   Cardiac Enzymes:  Recent Labs Lab 04/10/15 0638  TROPONINI <0.03   BNP (last 3 results) No results for input(s): BNP in the last 8760 hours.  ProBNP (last 3 results) No results for input(s): PROBNP in the last 8760 hours.  CBG: No results for input(s): GLUCAP in the last 168 hours.  No results found for this or any previous visit (from the past 240 hour(s)).   Studies: Dg Chest 2 View  04/09/2015  CLINICAL DATA:  Chest pain EXAM: CHEST  2 VIEW COMPARISON:  09/05/2013 FINDINGS: Normal heart size and mediastinal contours. No acute infiltrate or edema. No effusion or pneumothorax. No acute osseous findings. IMPRESSION: No active  cardiopulmonary disease. Electronically Signed   By: Marnee Spring M.D.   On: 04/09/2015 23:37    Scheduled Meds: . albuterol  2.5 mg Nebulization BID  . azithromycin  500 mg Intravenous Q24H  . budesonide (PULMICORT) nebulizer solution  0.25 mg Nebulization BID  . enoxaparin (LOVENOX) injection  40 mg Subcutaneous Q24H  . methylPREDNISolone (SOLU-MEDROL) injection  40 mg Intravenous Q12H   Continuous Infusions:   Principal Problem:   Asthma exacerbation Active Problems:   Cigarette smoker one half pack a day or less   Pleuritic chest pain    Time spent: 35 minutes.      Hartley Barefoot A  [facility] Pager [phone]. If 7PM-7AM, please contact night-coverage at [email or web address], password [facility] 04/10/2015, 10:39 AM  LOS: 0 days

## 2015-04-10 NOTE — ED Provider Notes (Signed)
CSN: [id number]     Arrival date & time 04/09/15  2145 History   First MD Initiated Contact with Patient 04/09/15 2247     Chief Complaint  Patient presents with  . Asthma  . Cough     (Consider location/radiation/quality/duration/timing/severity/associated sxs/prior Treatment) HPI   Luis Gay is a 50 y.o M with a pmhx of asthma, tobacco abuse who presents to the ED today c/o cough, wheezing and shortness of breath. Pt states that he felt like he was catching a cold when he woke up this morning with associated congestion and non-productive cough. Pt states that throughout the day he began wheezing and became more short of breath than normal. Patient also has pain in the center of his chest with coughing. Pt states that he is out of his home inhaler and he feels like his asthma is acting up. Pt states that this happens about once a year and he has had to be admitted for this in the past. Denies chest pain, fevers, chills, dizziness, syncope.   Past Medical History  Diagnosis Date  . Asthma   . Tobacco use    History reviewed. No pertinent past surgical history. Family History  Problem Relation Age of Onset  . Asthma Father   . Diabetes    . High blood pressure     Social History  Substance Use Topics  . Smoking status: Current Every Day Smoker -- 0.25 packs/day for 30 years    Types: Cigarettes  . Smokeless tobacco: None  . Alcohol Use: 7.2 oz/week    12 Cans of beer per week     Comment: Drink on weekends about a sixpack per day    Review of Systems  All other systems reviewed and are negative.     Allergies  Review of patient's allergies indicates no known allergies.  Home Medications   Prior to Admission medications   Medication Sig Start Date End Date Taking? Authorizing Provider  albuterol (PROVENTIL HFA;VENTOLIN HFA) 108 (90 BASE) MCG/ACT inhaler Inhale 2 puffs into the lungs every 4 (four) hours as needed for wheezing. 10/19/11 04/09/15 Yes Mancel Bale,  MD  albuterol (PROVENTIL HFA;VENTOLIN HFA) 108 (90 BASE) MCG/ACT inhaler Inhale 2 puffs into the lungs every 6 (six) hours as needed. For shortness of breath Patient not taking: Reported on 04/09/2015 09/06/13   Leatha Gilding, MD  fluticasone (FLOVENT HFA) 110 MCG/ACT inhaler Inhale 1 puff into the lungs 2 (two) times daily. Patient not taking: Reported on 04/09/2015 09/06/13   Leatha Gilding, MD  predniSONE (DELTASONE) 20 MG tablet Take 1 tablet (20 mg total) by mouth daily with breakfast. Take 2 pills daily for 2 days then 1 pill daily for 3 days then half a pill until finished. Patient not taking: Reported on 04/09/2015 09/06/13   Leatha Gilding, MD   BP 127/80 mmHg  Pulse 105  Temp(Src) 98.1 F (36.7 C) (Oral)  Resp 22  SpO2 100% Physical Exam  Constitutional: He is oriented to person, place, and time. He appears well-developed and well-nourished. No distress.  HENT:  Head: Normocephalic and atraumatic.  Mouth/Throat: Oropharynx is clear and moist. No oropharyngeal exudate.  Eyes: Conjunctivae and EOM are normal. Pupils are equal, round, and reactive to light. Right eye exhibits no discharge. Left eye exhibits no discharge. No scleral icterus.  Cardiovascular: Regular rhythm, normal heart sounds and intact distal pulses.  Exam reveals no gallop and no friction rub.   No murmur heard. Patient is tachycardic to  112 bpm.  Pulmonary/Chest: Effort normal. No respiratory distress. He has wheezes ( In all lung fields). He has no rales. He exhibits tenderness ( Substernal).  Abdominal: Soft. He exhibits no distension. There is no tenderness. There is no guarding.  Musculoskeletal: Normal range of motion. He exhibits no edema.  Neurological: He is alert and oriented to person, place, and time.  Strength 5/5 throughout. No sensory deficits.    Skin: Skin is warm and dry. No rash noted. He is not diaphoretic. No erythema. No pallor.  Psychiatric: He has a normal mood and affect. His behavior is  normal.  Nursing note and vitals reviewed.   ED Course  Procedures (including critical care time) Labs Review Labs Reviewed  BASIC METABOLIC PANEL - Abnormal; Notable for the following:    Glucose, Bld 113 (*)    All other components within normal limits  CBC  I-STAT TROPOININ, ED    Imaging Review Dg Chest 2 View  04/09/2015  CLINICAL DATA:  Chest pain EXAM: CHEST  2 VIEW COMPARISON:  09/05/2013 FINDINGS: Normal heart size and mediastinal contours. No acute infiltrate or edema. No effusion or pneumothorax. No acute osseous findings. IMPRESSION: No active cardiopulmonary disease. Electronically Signed   By: Marnee Spring M.D.   On: 04/09/2015 23:37   I have personally reviewed and evaluated these images and lab results as part of my medical decision-making.   EKG Interpretation   Date/Time:  Tuesday April 09 2015 22:11:50 EST Ventricular Rate:  118 PR Interval:  150 QRS Duration: 84 QT Interval:  330 QTC Calculation: 462 R Axis:   76 Text Interpretation:  Sinus tachycardia Septal infarct , age undetermined  Abnormal ECG Sinus tachycardia T wave abnormality No significant change  since last tracing Abnormal ekg Confirmed by Gerhard Munch  MD [phone])  on 04/09/2015 10:32:04 PM      MDM   Final diagnoses:  None    50 year old male with past medical history of asthma, tobacco use presents for cough, wheezing and shortness of breath. On presentation to the ED patient is tachycardic to 112 bpm with audible wheezing. Patient given 1 DuoNeb with minimal relief. He was then placed on a continuous albuterol nebulizer and given Decadron. No tachypnea or hypoxia.   Chest x-ray negative for infection. EKG unchanged from previous. No leukocytosis. Troponin within normal limits.  Upon reexamination patient remains tachycardic and states that his breathing is unchanged. Patient still has inspiratory and expiratory wheezes in all lung fields. Pt given magnesium and Toradol for  chest pain. Given the patient's exam is unchanged I will consult the hospitalist to recommend admission for continued bronchodilators for asthma exacerbation. Patient has required admission in the past for the same symptoms.  Spoke with hospitalist who will admit patient to their service to telemetry bed.  Discussed with Dr. Jeraldine Loots who agrees with treatment plan.    Lester Kinsman [place], PA-C 04/10/15 0125  Gerhard Munch, MD 04/10/15 2329

## 2015-04-11 ENCOUNTER — Ambulatory Visit (HOSPITAL_COMMUNITY): Payer: Self-pay

## 2015-04-11 DIAGNOSIS — R0781 Pleurodynia: Secondary | ICD-10-CM

## 2015-04-11 MED ORDER — AZITHROMYCIN 500 MG PO TABS
500.0000 mg | ORAL_TABLET | Freq: Every day | ORAL | Status: DC
  Administered 2015-04-12 – 2015-04-13 (×2): 500 mg via ORAL
  Filled 2015-04-11 (×2): qty 1

## 2015-04-11 MED ORDER — IPRATROPIUM-ALBUTEROL 0.5-2.5 (3) MG/3ML IN SOLN
3.0000 mL | Freq: Three times a day (TID) | RESPIRATORY_TRACT | Status: DC
  Administered 2015-04-11 – 2015-04-12 (×3): 3 mL via RESPIRATORY_TRACT
  Filled 2015-04-11 (×3): qty 3

## 2015-04-11 MED ORDER — GUAIFENESIN ER 600 MG PO TB12
600.0000 mg | ORAL_TABLET | Freq: Two times a day (BID) | ORAL | Status: DC
  Administered 2015-04-11 – 2015-04-13 (×5): 600 mg via ORAL
  Filled 2015-04-11 (×5): qty 1

## 2015-04-11 MED ORDER — ALBUTEROL SULFATE (2.5 MG/3ML) 0.083% IN NEBU: 2.5000 mg | INHALATION_SOLUTION | RESPIRATORY_TRACT | Status: DC | PRN

## 2015-04-11 NOTE — Progress Notes (Signed)
[facility] PROGRESS NOTE  Luis Gay. ZOX:[id number] DOB: 1965/09/03 DOA: 04/09/2015 PCP: Valera Castle, MD  Assessment/Plan: Luis Gay. is Gay 50 y.o. male with history of asthma and ongoing tobacco abuse presents to the ER because of increasing shortness of breath over the last 2 days. Patient has been having some nonproductive cough. And also has been having pleuritic type of chest pain. In the ER patient was found to have bilateral expiratory wheeze and was given multiple doses of nebulizer despite which patient is still short of breath and will be admitted for asthma exacerbation. Patient's chest pain is only on deep inspiration. Has been having some non-productive cough. Denies any fever chills or sick contacts.    Asthma exacerbation - patient has been placed on IV Solu-Medrol Pulmicort nebulizer and Zithromax. Continue with nebulizer.  Still wheezing, not at baseline.   Pleuritic-type of chest pain - D dimer negative, troponin negative. Has some EKG changes. Chest pain might be related to asthma.  ECHO pending. .   Tobacco abuse - patient advised to quit smoking.  Alcohol abuse - patient is on CIWA protocol.  Code Status: Full code.  Family Communication: care discussed with patient.  Disposition Plan: remain inpatient for treatment of asthma exacerbation.    Consultants:  none  Procedures:  ECHO  Antibiotics:  Azithromycin   HPI/Subjective: Still does not feels at baseline. Still wheezing.   Objective: Filed Vitals:   04/10/15 2147 04/11/15 0414  BP: 124/77 121/61  Pulse: 94 95  Temp: 98.4 F (36.9 C) 98.1 F (36.7 C)  Resp: 18 18    Intake/Output Summary (Last 24 hours) at 04/11/15 1405 Last data filed at 04/11/15 0830  Gross per 24 hour  Intake    480 ml  Output      0 ml  Net    480 ml   Filed Weights   04/10/15 0603  Weight: 104.645 kg (230 lb 11.2 oz)    Exam:   General: NAD  Cardiovascular: S 1, S 2  RRR  Respiratory: Bilateral wheezing  Abdomen: bs present, soft, nt  Musculoskeletal: no edema   Data Reviewed: Basic Metabolic Panel:  Recent Labs Lab 04/09/15 2226 04/10/15 0638  NA 138 136  K 3.7 4.3  CL 104 103  CO2 25 21*  GLUCOSE 113* 159*  BUN 14 11  CREATININE 1.23 1.19  CALCIUM 9.8 9.7   Liver Function Tests: No results for input(s): AST, ALT, ALKPHOS, BILITOT, PROT, ALBUMIN in the last 168 hours. No results for input(s): LIPASE, AMYLASE in the last 168 hours. No results for input(s): AMMONIA in the last 168 hours. CBC:  Recent Labs Lab 04/09/15 2226 04/10/15 0638  WBC 8.7 8.3  NEUTROABS  --  7.8*  HGB 13.3 13.0  HCT 39.7 37.6*  MCV 86.7 86.4  PLT 251 249   Cardiac Enzymes:  Recent Labs Lab 04/10/15 0638 04/10/15 1218 04/10/15 1827  TROPONINI <0.03 0.03 0.03   BNP (last 3 results) No results for input(s): BNP in the last 8760 hours.  ProBNP (last 3 results) No results for input(s): PROBNP in the last 8760 hours.  CBG: No results for input(s): GLUCAP in the last 168 hours.  No results found for this or any previous visit (from the past 240 hour(s)).   Studies: Dg Chest 2 View  04/09/2015  CLINICAL DATA:  Chest pain EXAM: CHEST  2 VIEW COMPARISON:  09/05/2013 FINDINGS: Normal heart size and mediastinal contours. No acute infiltrate or  edema. No effusion or pneumothorax. No acute osseous findings. IMPRESSION: No active cardiopulmonary disease. Electronically Signed   By: Marnee Spring M.D.   On: 04/09/2015 23:37    Scheduled Meds: . [START ON 04/12/2015] azithromycin  500 mg Oral Daily  . budesonide (PULMICORT) nebulizer solution  0.25 mg Nebulization BID  . enoxaparin (LOVENOX) injection  40 mg Subcutaneous Q24H  . folic acid  1 mg Oral Daily  . guaiFENesin  600 mg Oral BID  . ipratropium-albuterol  3 mL Nebulization TID  . methylPREDNISolone (SOLU-MEDROL) injection  60 mg Intravenous 3 times per day  . multivitamin with minerals  1  tablet Oral Daily  . pantoprazole  40 mg Oral Daily  . thiamine  100 mg Oral Daily   Continuous Infusions:   Principal Problem:   Asthma exacerbation Active Problems:   Cigarette smoker one half pack [name]   Pleuritic chest pain    Time spent: 35 minutes.     Luis Gay  [facility] Pager [phone]. If 7PM-7AM, please contact night-coverage at [email or web address], password [facility] 04/11/2015, 2:05 PM  LOS: 1 day

## 2015-04-12 ENCOUNTER — Ambulatory Visit (HOSPITAL_COMMUNITY): Payer: No Typology Code available for payment source

## 2015-04-12 DIAGNOSIS — R079 Chest pain, unspecified: Secondary | ICD-10-CM

## 2015-04-12 LAB — BASIC METABOLIC PANEL WITH GFR
Anion gap: 11 (ref 5–15)
BUN: 15 mg/dL (ref 6–20)
CO2: 23 mmol/L (ref 22–32)
Calcium: 10.3 mg/dL (ref 8.9–10.3)
Chloride: 101 mmol/L (ref 101–111)
Creatinine, Ser: 1.07 mg/dL (ref 0.61–1.24)
GFR calc Af Amer: >60 mL/min
GFR calc non Af Amer: >60 mL/min
Glucose, Bld: 181 mg/dL — ABNORMAL HIGH (ref 65–99)
Potassium: 4.7 mmol/L (ref 3.5–5.1)
Sodium: 135 mmol/L (ref 135–145)

## 2015-04-12 MED ORDER — IPRATROPIUM-ALBUTEROL 0.5-2.5 (3) MG/3ML IN SOLN
3.0000 mL | Freq: Four times a day (QID) | RESPIRATORY_TRACT | Status: DC
  Administered 2015-04-12 – 2015-04-13 (×4): 3 mL via RESPIRATORY_TRACT
  Filled 2015-04-12 (×4): qty 3

## 2015-04-12 NOTE — Care Management Note (Addendum)
Case Management Note Donn Pierini RN, BSN Unit 2W-Case Manager [phone]  Patient Details  Name: Luis Gay. MRN: [id number] Date of Birth: [birth year]  Subjective/Objective:    Pt admitted with Asthma, PCP - Dr. Daleen Squibb             Action/Plan: PTA pt lived at home, referral for potential medication needs- in to speak with pt who states that he no longer has a PCP and he can not afford medications- he reports that he has been going to the [facility] for some assistance with medications- pt would be eligible for [facility] program which was discussed with pt- pt states that he would not be able to afford $3 copay for medications with MATCH program- would have to see if pt could get approved for override if needed- per pt he states all he needs is an inhaler for discharge. Pt agreeable to being set up for f/u at one of the clinics- call made to the [facility] and appointment made for 04/19/15 at 12 noon- info given to pt on clinic and appointment- pt will need to speak with them regarding further medication assistance at the clinic to see if he is eligible for their PASS program. Pt also indicated that he will need a bus pass- CSW referral made.  Expected Discharge Date:                  Expected Discharge Plan:  Home/Self Care  In-House Referral:  Clinical Social Work  Discharge planning Services  CM Consult, MATCH Program, Medication Assistance  Post Acute Care Choice:    Choice offered to:     DME Arranged:    DME Agency:     HH Arranged:    HH Agency:     Status of Service:  In process, will continue to follow  Medicare Important Message Given:    Date Medicare IM Given:    Medicare IM give by:    Date Additional Medicare IM Given:    Additional Medicare Important Message give by:     If discussed at Long Length of Stay Meetings, dates discussed:    Additional Comments:  Darrold Span, RN 04/12/2015, 2:21 PM

## 2015-04-12 NOTE — Progress Notes (Signed)
  Echocardiogram 2D Echocardiogram has been performed.  Leta Jungling M 04/12/2015, 1:08 PM

## 2015-04-12 NOTE — Progress Notes (Signed)
[facility] PROGRESS NOTE  Luis Gay. UJW:[id number] DOB: [birth year] DOA: 04/09/2015 PCP: Valera Castle, MD  Assessment/Plan: Luis Heinle. is a 50 y.o. male with history of asthma and ongoing tobacco abuse presents to the ER because of increasing shortness of breath over the last 2 days. Patient has been having some nonproductive cough. And also has been having pleuritic type of chest pain. In the ER patient was found to have bilateral expiratory wheeze and was given multiple doses of nebulizer despite which patient is still short of breath and will be admitted for asthma exacerbation. Patient's chest pain is only on deep inspiration. Has been having some non-productive cough. Denies any fever chills or sick contacts.    Asthma exacerbation - patient has been placed on IV Solu-Medrol Pulmicort nebulizer and Zithromax. Continue with nebulizer.  Still wheezing, not at baseline. Continue with current treatments,.   Pleuritic-type of chest pain - D dimer negative, troponin negative. Has some EKG changes. Chest pain might be related to asthma.  ECHO pending. .   Tobacco abuse - patient advised to quit smoking.  Alcohol abuse - patient is on CIWA protocol.  Code Status: Full code.  Family Communication: care discussed with patient.  Disposition Plan: remain inpatient for treatment of asthma exacerbation.    Consultants:  none  Procedures:  ECHO  Antibiotics:  Azithromycin   HPI/Subjective: Feeling a little better, not breathing at baseline.   Objective: Filed Vitals:   04/11/15 2159 04/12/15 0414  BP:  131/78  Pulse: 88 89  Temp:  97.9 F (36.6 C)  Resp: 16 16    Intake/Output Summary (Last 24 hours) at 04/12/15 1236 Last data filed at 04/12/15 1200  Gross per 24 hour  Intake    360 ml  Output      0 ml  Net    360 ml   Filed Weights   04/10/15 0603  Weight: 104.645 kg (230 lb 11.2 oz)    Exam:   General: NAD  Cardiovascular: S 1, S  2 RRR  Respiratory: Bilateral wheezing  Abdomen: bs present, soft, nt  Musculoskeletal: no edema   Data Reviewed: Basic Metabolic Panel:  Recent Labs Lab 04/09/15 2226 04/10/15 0638  NA 138 136  K 3.7 4.3  CL 104 103  CO2 25 21*  GLUCOSE 113* 159*  BUN 14 11  CREATININE 1.23 1.19  CALCIUM 9.8 9.7   Liver Function Tests: No results for input(s): AST, ALT, ALKPHOS, BILITOT, PROT, ALBUMIN in the last 168 hours. No results for input(s): LIPASE, AMYLASE in the last 168 hours. No results for input(s): AMMONIA in the last 168 hours. CBC:  Recent Labs Lab 04/09/15 2226 04/10/15 0638  WBC 8.7 8.3  NEUTROABS  --  7.8*  HGB 13.3 13.0  HCT 39.7 37.6*  MCV 86.7 86.4  PLT 251 249   Cardiac Enzymes:  Recent Labs Lab 04/10/15 0638 04/10/15 1218 04/10/15 1827  TROPONINI <0.03 0.03 0.03   BNP (last 3 results) No results for input(s): BNP in the last 8760 hours.  ProBNP (last 3 results) No results for input(s): PROBNP in the last 8760 hours.  CBG: No results for input(s): GLUCAP in the last 168 hours.  No results found for this or any previous visit (from the past 240 hour(s)).   Studies: No results found.  Scheduled Meds: . azithromycin  500 mg Oral Daily  . budesonide (PULMICORT) nebulizer solution  0.25 mg Nebulization BID  . enoxaparin (LOVENOX) injection  40 mg Subcutaneous Q24H  . folic acid  1 mg Oral Daily  . guaiFENesin  600 mg Oral BID  . ipratropium-albuterol  3 mL Nebulization QID  . methylPREDNISolone (SOLU-MEDROL) injection  60 mg Intravenous 3 times per day  . multivitamin with minerals  1 tablet Oral Daily  . pantoprazole  40 mg Oral Daily  . thiamine  100 mg Oral Daily   Continuous Infusions:   Principal Problem:   Asthma exacerbation Active Problems:   Cigarette smoker one half pack a day or less   Pleuritic chest pain    Time spent: 35 minutes.     Hartley Barefoot A  [facility] Pager [phone]. If 7PM-7AM, please  contact night-coverage at [email or web address], password [facility] 04/12/2015, 12:36 PM  LOS: 2 days

## 2015-04-13 MED ORDER — GUAIFENESIN ER 600 MG PO TB12: 600.0000 mg | ORAL_TABLET | Freq: Two times a day (BID) | ORAL | Status: DC

## 2015-04-13 MED ORDER — IPRATROPIUM-ALBUTEROL 0.5-2.5 (3) MG/3ML IN SOLN: 3.0000 mL | Freq: Two times a day (BID) | RESPIRATORY_TRACT | Status: DC

## 2015-04-13 MED ORDER — PREDNISONE 20 MG PO TABS: ORAL_TABLET | ORAL | Status: DC

## 2015-04-13 MED ORDER — ALBUTEROL SULFATE HFA 108 (90 BASE) MCG/ACT IN AERS: 2.0000 | INHALATION_SPRAY | RESPIRATORY_TRACT | Status: DC | PRN

## 2015-04-13 MED ORDER — FLUTICASONE PROPIONATE HFA 110 MCG/ACT IN AERO: 1.0000 | INHALATION_SPRAY | Freq: Two times a day (BID) | RESPIRATORY_TRACT | Status: DC

## 2015-04-13 MED ORDER — AZITHROMYCIN 500 MG PO TABS: 500.0000 mg | ORAL_TABLET | Freq: Every day | ORAL | Status: DC

## 2015-04-13 MED ORDER — ALBUTEROL SULFATE (2.5 MG/3ML) 0.083% IN NEBU: 3.0000 mL | INHALATION_SOLUTION | Freq: Four times a day (QID) | RESPIRATORY_TRACT | Status: DC

## 2015-04-13 MED ORDER — IPRATROPIUM BROMIDE HFA 17 MCG/ACT IN AERS: 2.0000 | INHALATION_SPRAY | Freq: Four times a day (QID) | RESPIRATORY_TRACT | Status: DC | PRN

## 2015-04-13 NOTE — Care Management (Addendum)
Case Management Note Initial Note Started By: Donn Pierini RN, BSN 04-12-15 Unit 2W-Case Manager [phone]  Patient Details  Name: Luis Gay. MRN: [id number] Date of Birth: [birth year]  Subjective/Objective: Pt admitted with Asthma, PCP - Dr. Daleen Squibb    Action/Plan: PTA pt lived at home, referral for potential medication needs- in to speak with pt who states that he no longer has a PCP and he can not afford medications- he reports that he has been going to the [facility] for some assistance with medications- pt would be eligible for [facility] program which was discussed with pt- pt states that he would not be able to afford $3 copay for medications with MATCH program- would have to see if pt could get approved for override if needed- per pt he states all he needs is an inhaler for discharge. Pt agreeable to being set up for f/u at one of the clinics- call made to the [facility] and appointment made for 04/19/15 at 12 noon- info given to pt on clinic and appointment- pt will need to speak with them regarding further medication assistance at the clinic to see if he is eligible for their PASS program. Pt also indicated that he will need a bus pass- CSW referral made.  Expected Discharge Date:     Expected Discharge Plan: Home/Self Care  In-House Referral: Clinical Social Work  Discharge planning Services CM Consult, MATCH Program, Medication Assistance  Post Acute Care Choice: N/A   Choice offered to:  N/A  DME Arranged:  N/A DME Agency:  N/A  HH Arranged:N/A   HH Agency:  N/ A  Status of Service: Completed  Medicare Important Message Given:   Date Medicare IM Given:   Medicare IM give by:   Date Additional Medicare IM Given:   Additional Medicare Important Message give by:    If discussed at Long Length of Stay Meetings, dates discussed:   Additional Comments: 1306 04-13-15 [name], RN,BSN [phone] MATCH Letter to be  provided to pt- CM did speak with pt in regards to medications. Pt is without any money and will need to have an override on all medications. Override to be done by CM [name]. This CM will provide pt with MATCH letter. Pt will need written Rx to go along with the [facility] Letter. CSW aware to provide bus pass. No further needs from CM at this time.

## 2015-04-13 NOTE — Discharge Summary (Signed)
Physician Discharge Summary  Luis Gay. JXB:[id number] DOB: July 02, 1965 DOA: 04/09/2015  PCP: Valera Castle, MD  Admit date: 04/09/2015 Discharge date: 04/13/2015  Time spent: 35 minutes  Recommendations for Outpatient Follow-up:  Need continue counseling regarding smoking cessation/  Need PFT   Discharge Diagnoses:    Asthma exacerbation   Cigarette smoker one half pack a day or less   Pleuritic chest pain   Discharge Condition: stable.   Diet recommendation: heart healthy   Filed Weights   04/10/15 0603  Weight: 104.645 kg (230 lb 11.2 oz)    History of present illness:  Luis Gay. is a 50 y.o. male with history of asthma and ongoing tobacco abuse presents to the ER because of increasing shortness of breath over the last 2 days. Patient has been having some nonproductive cough. And also has been having pleuritic type of chest pain. In the ER patient was found to have bilateral expiratory wheeze and was given multiple doses of nebulizer despite which patient is still short of breath and will be admitted for asthma exacerbation. Patient's chest pain is only on deep inspiration. Has been having some non-productive cough. Denies any fever chills or sick contacts.   Hospital Course:  Asthma exacerbation - patient received  IV Solu-Medrol, Pulmicort nebulizer and Zithromax. Continue with nebulizer.  He is feeling better, lungs clear on exam.  He will be discharge on albuterol, ipratropium, prednisone, Flovent.  Needs Pulmonary function test    Pleuritic-type of chest pain - D dimer negative, troponin negative. Has some EKG changes. Chest pain might be related to asthma. ECHO normal   Tobacco abuse - patient advised to quit smoking.  Alcohol abuse - patient was  on CIWA protocol. No evidence of withdrawal.   Procedures:  ECHO; Left ventricle: The cavity size was normal. Wall thickness was normal. Systolic function was vigorous. The estimated  ejection fraction was in the range of 65% to 70%. Wall motion was normal; there were no regional wall motion abnormalities.  Consultations:  none  Discharge Exam: Filed Vitals:   04/12/15 2125 04/13/15 0513  BP: 138/74 127/80  Pulse: 80 82  Temp: 98.3 F (36.8 C) 98.1 F (36.7 C)  Resp: 17 18    General: NAD Cardiovascular: S 1, S 2 RRR Respiratory: CTA  Discharge Instructions   Discharge Instructions    Diet - low sodium heart healthy    Complete by:  As directed      Increase activity slowly    Complete by:  As directed           Current Discharge Medication List    START taking these medications   Details  azithromycin (ZITHROMAX) 500 MG tablet Take 1 tablet (500 mg total) by mouth daily. Qty: 5 tablet, Refills: 0    guaiFENesin (MUCINEX) 600 MG 12 hr tablet Take 1 tablet (600 mg total) by mouth 2 (two) times daily. Qty: 30 tablet, Refills: 0    ipratropium (ATROVENT HFA) 17 MCG/ACT inhaler Inhale 2 puffs into the lungs every 6 (six) hours as needed for wheezing. Qty: 1 Inhaler, Refills: 12      CONTINUE these medications which have CHANGED   Details  albuterol (PROVENTIL HFA;VENTOLIN HFA) 108 (90 Base) MCG/ACT inhaler Inhale 2 puffs into the lungs every 4 (four) hours as needed for wheezing. Qty: 8.5 g, Refills: 0    fluticasone (FLOVENT HFA) 110 MCG/ACT inhaler Inhale 1 puff into the lungs 2 (two) times daily. Qty: 1 Inhaler,  Refills: 6    predniSONE (DELTASONE) 20 MG tablet Take 3 pills daily for 2 days then 2 pill daily for 2 days then 1 pill daily  Until  finished. Qty: 12 tablet, Refills: 0       No Known Allergies Follow-up Information    Follow up with [facility] On 04/19/2015.   Why:  F/u appointment  and to establish care- 12 noon   Contact information:   [address] [facility] [address] [phone]       The results of significant diagnostics from this hospitalization  (including imaging, microbiology, ancillary and laboratory) are listed below for reference.    Significant Diagnostic Studies: Dg Chest 2 View  04/09/2015  CLINICAL DATA:  Chest pain EXAM: CHEST  2 VIEW COMPARISON:  09/05/2013 FINDINGS: Normal heart size and mediastinal contours. No acute infiltrate or edema. No effusion or pneumothorax. No acute osseous findings. IMPRESSION: No active cardiopulmonary disease. Electronically Signed   By: Marnee Spring M.D.   On: 04/09/2015 23:37    Microbiology: No results found for this or any previous visit (from the past 240 hour(s)).   Labs: Basic Metabolic Panel:  Recent Labs Lab 04/09/15 2226 04/10/15 0638 04/12/15 1151  NA 138 136 135  K 3.7 4.3 4.7  CL 104 103 101  CO2 25 21* 23  GLUCOSE 113* 159* 181*  BUN CREATININE 1.23 1.19 1.07  CALCIUM 9.8 9.7 10.3   Liver Function Tests: No results for input(s): AST, ALT, ALKPHOS, BILITOT, PROT, ALBUMIN in the last 168 hours. No results for input(s): LIPASE, AMYLASE in the last 168 hours. No results for input(s): AMMONIA in the last 168 hours. CBC:  Recent Labs Lab 04/09/15 2226 04/10/15 0638  WBC 8.7 8.3  NEUTROABS  --  7.8*  HGB 13.3 13.0  HCT 39.7 37.6*  MCV 86.7 86.4  PLT 251 249   Cardiac Enzymes:  Recent Labs Lab 04/10/15 0638 04/10/15 1218 04/10/15 1827  TROPONINI <0.03 0.03 0.03   BNP: BNP (last 3 results) No results for input(s): BNP in the last 8760 hours.  ProBNP (last 3 results) No results for input(s): PROBNP in the last 8760 hours.  CBG: No results for input(s): GLUCAP in the last 168 hours.     Signed:  Alba Cory MD.  [facility] 04/13/2015, 10:52 AM

## 2015-04-13 NOTE — Progress Notes (Signed)
Pt discharging with bus pass given.  All instructions and prescriptions given and reviewed, all questions answered.

## 2015-04-19 ENCOUNTER — Inpatient Hospital Stay: Payer: No Typology Code available for payment source | Admitting: Family Medicine

## 2015-04-19 NOTE — Progress Notes (Signed)
CSW notified by unit RN that patient required a bus pass for transportation home.  CSW provided bus pass and patient d/c'd. No further needs identified. CSW signing off.  Lorri Frederick. Jaci Lazier, [place] [phone]

## 2015-07-17 ENCOUNTER — Emergency Department (HOSPITAL_COMMUNITY)
Admission: EM | Admit: 2015-07-17 | Discharge: 2015-07-17 | Disposition: A | Discharge status: Home / Self Care | Payer: Self-pay | Attending: Emergency Medicine | Admitting: Emergency Medicine

## 2015-07-17 ENCOUNTER — Emergency Department (HOSPITAL_COMMUNITY): Payer: Self-pay

## 2015-07-17 ENCOUNTER — Encounter (HOSPITAL_COMMUNITY): Payer: Self-pay | Admitting: Emergency Medicine

## 2015-07-17 DIAGNOSIS — J209 Acute bronchitis, unspecified: Secondary | ICD-10-CM | POA: Insufficient documentation

## 2015-07-17 DIAGNOSIS — Z7951 Long term (current) use of inhaled steroids: Secondary | ICD-10-CM | POA: Insufficient documentation

## 2015-07-17 DIAGNOSIS — Z79899 Other long term (current) drug therapy: Secondary | ICD-10-CM | POA: Insufficient documentation

## 2015-07-17 DIAGNOSIS — F17210 Nicotine dependence, cigarettes, uncomplicated: Secondary | ICD-10-CM | POA: Insufficient documentation

## 2015-07-17 DIAGNOSIS — Z792 Long term (current) use of antibiotics: Secondary | ICD-10-CM | POA: Insufficient documentation

## 2015-07-17 DIAGNOSIS — J40 Bronchitis, not specified as acute or chronic: Secondary | ICD-10-CM

## 2015-07-17 DIAGNOSIS — J9801 Acute bronchospasm: Secondary | ICD-10-CM

## 2015-07-17 DIAGNOSIS — Z7952 Long term (current) use of systemic steroids: Secondary | ICD-10-CM | POA: Insufficient documentation

## 2015-07-17 LAB — COMPREHENSIVE METABOLIC PANEL
ALBUMIN: 4.4 g/dL (ref 3.5–5.0)
ALK PHOS: 67 U/L (ref 38–126)
ALT: 20 U/L (ref 17–63)
AST: 20 U/L (ref 15–41)
Anion gap: 9 (ref 5–15)
BUN: 12 mg/dL (ref 6–20)
CALCIUM: 9.8 mg/dL (ref 8.9–10.3)
CHLORIDE: 108 mmol/L (ref 101–111)
CO2: 23 mmol/L (ref 22–32)
CREATININE: 0.97 mg/dL (ref 0.61–1.24)
GFR calc non Af Amer: >60 mL/min (ref >60)
GLUCOSE: 120 mg/dL — AB (ref 65–99)
Potassium: 3.8 mmol/L (ref 3.5–5.1)
SODIUM: 140 mmol/L (ref 135–145)
Total Bilirubin: 0.5 mg/dL (ref 0.3–1.2)
Total Protein: 7.3 g/dL (ref 6.5–8.1)

## 2015-07-17 LAB — CBC WITH DIFFERENTIAL/PLATELET
BASOS ABS: 0 10*3/uL (ref 0.0–0.1)
Basophils Relative: 0 %
EOS ABS: 0.2 10*3/uL (ref 0.0–0.7)
EOS PCT: 3 %
HCT: 38.9 % — ABNORMAL LOW (ref 39.0–52.0)
HEMOGLOBIN: 13.3 g/dL (ref 13.0–17.0)
LYMPHS ABS: 1 10*3/uL (ref 0.7–4.0)
Lymphocytes Relative: 13 %
MCH: 29.2 pg (ref 26.0–34.0)
MCHC: 34.2 g/dL (ref 30.0–36.0)
MCV: 85.5 fL (ref 78.0–100.0)
Monocytes Absolute: 0.2 10*3/uL (ref 0.1–1.0)
Monocytes Relative: 2 %
Neutro Abs: 6.6 10*3/uL (ref 1.7–7.7)
Neutrophils Relative %: 82 %
Platelets: 281 10*3/uL (ref 150–400)
RBC: 4.55 MIL/uL (ref 4.22–5.81)
RDW: 12.7 % (ref 11.5–15.5)
WBC: 8 10*3/uL (ref 4.0–10.5)

## 2015-07-17 MED ORDER — PREDNISONE 20 MG PO TABS
60.0000 mg | ORAL_TABLET | Freq: Once | ORAL | Status: AC
  Administered 2015-07-17: 60 mg via ORAL
  Filled 2015-07-17: qty 3

## 2015-07-17 MED ORDER — ALBUTEROL SULFATE HFA 108 (90 BASE) MCG/ACT IN AERS
2.0000 | INHALATION_SPRAY | RESPIRATORY_TRACT | Status: DC | PRN
  Administered 2015-07-17: 2 via RESPIRATORY_TRACT
  Filled 2015-07-17: qty 6.7

## 2015-07-17 MED ORDER — ALBUTEROL SULFATE (2.5 MG/3ML) 0.083% IN NEBU
2.5000 mg | INHALATION_SOLUTION | Freq: Once | RESPIRATORY_TRACT | Status: AC
  Administered 2015-07-17: 2.5 mg via RESPIRATORY_TRACT
  Filled 2015-07-17: qty 3

## 2015-07-17 MED ORDER — IPRATROPIUM-ALBUTEROL 0.5-2.5 (3) MG/3ML IN SOLN
3.0000 mL | Freq: Once | RESPIRATORY_TRACT | Status: AC
  Administered 2015-07-17: 3 mL via RESPIRATORY_TRACT
  Filled 2015-07-17: qty 3

## 2015-07-17 MED ORDER — AMOXICILLIN 500 MG PO CAPS: 500.0000 mg | ORAL_CAPSULE | Freq: Three times a day (TID) | ORAL | Status: DC

## 2015-07-17 MED ORDER — PREDNISONE 10 MG PO TABS: 20.0000 mg | ORAL_TABLET | Freq: Every day | ORAL | Status: DC

## 2015-07-17 NOTE — ED Notes (Signed)
MD at bedside. 

## 2015-07-17 NOTE — ED Notes (Signed)
Bed: ZO10WA05 Expected date:  Expected time:  Means of arrival:  Comments: EMS - asthma - room 5

## 2015-07-17 NOTE — Discharge Instructions (Signed)
Follow-up with family doctor if not improving 

## 2015-07-17 NOTE — ED Provider Notes (Signed)
CSN: [id number]     Arrival date & time 07/17/15  1651 History   First MD Initiated Contact with Patient 07/17/15 1655     Chief Complaint  Patient presents with  . Asthma     (Consider location/radiation/quality/duration/timing/severity/associated sxs/prior Treatment) Patient is a 50 y.o. male presenting with asthma. The history is provided by the patient (Patient complains of cough and shortness of breath and wheezing).  Asthma This is a recurrent problem. The current episode started yesterday. The problem occurs constantly. The problem has not changed since onset.Pertinent negatives include no chest pain, no abdominal pain and no headaches. Nothing aggravates the symptoms.    Past Medical History  Diagnosis Date  . Asthma   . Tobacco use    History reviewed. No pertinent past surgical history. Family History  Problem Relation Age of Onset  . Asthma Father   . Diabetes    . High blood pressure     Social History  Substance Use Topics  . Smoking status: Current Every Day Smoker -- 0.25 packs/day for 30 years    Types: Cigarettes  . Smokeless tobacco: None  . Alcohol Use: 7.2 oz/week    12 Cans of beer per week     Comment: Drink on weekends about a sixpack per day    Review of Systems  Constitutional: Negative for appetite change and fatigue.  HENT: Negative for congestion, ear discharge and sinus pressure.   Eyes: Negative for discharge.  Respiratory: Positive for cough and wheezing.   Cardiovascular: Negative for chest pain.  Gastrointestinal: Negative for abdominal pain and diarrhea.  Genitourinary: Negative for frequency and hematuria.  Musculoskeletal: Negative for back pain.  Skin: Negative for rash.  Neurological: Negative for seizures and headaches.  Psychiatric/Behavioral: Negative for hallucinations.      Allergies  Review of patient's allergies indicates no known allergies.  Home Medications   Prior to Admission medications   Medication Sig Start  Date End Date Taking? Authorizing Provider  albuterol (PROVENTIL HFA;VENTOLIN HFA) 108 (90 Base) MCG/ACT inhaler Inhale 2 puffs into the lungs every 4 (four) hours as needed for wheezing. 04/13/15 10/02/18 Yes Belkys A Regalado, MD  amoxicillin (AMOXIL) 500 MG capsule Take 1 capsule (500 mg total) by mouth 3 (three) times daily. 07/17/15   Bethann BerkshireJoseph Jailyne Chieffo, MD  azithromycin (ZITHROMAX) 500 MG tablet Take 1 tablet (500 mg total) by mouth daily. Patient not taking: Reported on 07/17/2015 04/13/15   Belkys A Regalado, MD  fluticasone (FLOVENT HFA) 110 MCG/ACT inhaler Inhale 1 puff into the lungs 2 (two) times daily. Patient not taking: Reported on 07/17/2015 04/13/15   Belkys A Regalado, MD  guaiFENesin (MUCINEX) 600 MG 12 hr tablet Take 1 tablet (600 mg total) by mouth 2 (two) times daily. Patient not taking: Reported on 07/17/2015 04/13/15   Belkys A Regalado, MD  ipratropium (ATROVENT HFA) 17 MCG/ACT inhaler Inhale 2 puffs into the lungs every 6 (six) hours as needed for wheezing. Patient not taking: Reported on 07/17/2015 04/13/15   Belkys A Regalado, MD  predniSONE (DELTASONE) 10 MG tablet Take 2 tablets (20 mg total) by mouth daily. 07/17/15   Bethann BerkshireJoseph Conlan Miceli, MD   BP 154/90 mmHg  Pulse 98  Temp(Src) 98 F (36.7 C) (Oral)  Resp 16  Ht 5\' 9"  (1.753 m)  Wt 230 lb (104.327 kg)  BMI 33.95 kg/m2  SpO2 100% Physical Exam  Constitutional: He is oriented to person, place, and time. He appears well-developed.  HENT:  Head: Normocephalic.  Eyes: Conjunctivae  and EOM are normal. No scleral icterus.  Neck: Neck supple. No thyromegaly present.  Cardiovascular: Normal rate and regular rhythm.  Exam reveals no gallop and no friction rub.   No murmur heard. Pulmonary/Chest: No stridor. He has wheezes. He has no rales. He exhibits no tenderness.  Abdominal: He exhibits no distension. There is no tenderness. There is no rebound.  Musculoskeletal: Normal range of motion. He exhibits no edema.  Lymphadenopathy:    He has  no cervical adenopathy.  Neurological: He is oriented to person, place, and time. He exhibits normal muscle tone. Coordination normal.  Skin: No rash noted. No erythema.  Psychiatric: He has a normal mood and affect. His behavior is normal.    ED Course  Procedures (including critical care time) Labs Review Labs Reviewed  CBC WITH DIFFERENTIAL/PLATELET - Abnormal; Notable for the following:    HCT 38.9 (*)    All other components within normal limits  COMPREHENSIVE METABOLIC PANEL - Abnormal; Notable for the following:    Glucose, Bld 120 (*)    All other components within normal limits    Imaging Review Dg Chest 2 View  07/17/2015  CLINICAL DATA:  Asthma attack today EXAM: CHEST  2 VIEW COMPARISON:  04/09/2015 FINDINGS: Cardiomediastinal silhouette is stable. No acute infiltrate or pleural effusion. No pulmonary edema. Mild hyperinflation. Mild degenerative changes lower thoracic spine. IMPRESSION: No infiltrate or pulmonary edema.  Mild hyperinflation. Electronically Signed   By: Natasha Mead M.D.   On: 07/17/2015 18:31   I have personally reviewed and evaluated these images and lab results as part of my medical decision-making.   EKG Interpretation None      MDM   Final diagnoses:  Bronchitis  Bronchospasm   Patient with bronchitis and bronchospasm. Patient put on prednisone and amoxicillin and albuterol inhaler will follow-up as needed   Bethann Berkshire, MD 07/17/15 1947

## 2015-07-17 NOTE — Progress Notes (Signed)
Patient listed as not having insurance, pcp Luis PomfretMaryanne Placey NP.  EDCm spoke to patient at bedside.  Patient reports he is staying with someone but he is homeless.  Patient reports he has resources for shelters and food pantries.  Patient reports [facility] with [facility].  EDCM asked patient if he sees [facility] NP for his medical needs and patient reported that he does not.  Reports he does not have a pcp.  Patient also reports that he used to get medication assistance [facility] but reports "I have to go through some kind of program."  [facility].  She reports the patient will have to sign up for medication assistance program which can take up to six to eight weeks but after that he can have his medications free for a year.  She reports she can see the patient for his medical needs.  [facility] informed [facility] of above.  Patient thankful for assistance.  No further EDCM needs at this time.  Patient is not eligible for medication assistance through [facility] as [facility] already [facility].

## 2015-07-17 NOTE — ED Notes (Addendum)
Per EMS, patient began to have difficulty breathing (now out of medications), inspiratory and expiratory wheezing upon EMS arrival Patient has had 2 nebs and solumedrol IV. Patient has expiratory wheezing. Hx of asthma.

## 2016-08-02 ENCOUNTER — Emergency Department (HOSPITAL_COMMUNITY)
Admission: EM | Admit: 2016-08-02 | Discharge: 2016-08-02 | Disposition: A | Discharge status: Home / Self Care | Payer: Self-pay | Attending: Emergency Medicine | Admitting: Emergency Medicine

## 2016-08-02 ENCOUNTER — Emergency Department (HOSPITAL_COMMUNITY): Payer: Self-pay

## 2016-08-02 ENCOUNTER — Encounter (HOSPITAL_COMMUNITY): Payer: Self-pay | Admitting: Emergency Medicine

## 2016-08-02 DIAGNOSIS — R000 Tachycardia, unspecified: Secondary | ICD-10-CM | POA: Insufficient documentation

## 2016-08-02 DIAGNOSIS — Z79899 Other long term (current) drug therapy: Secondary | ICD-10-CM | POA: Insufficient documentation

## 2016-08-02 DIAGNOSIS — J4541 Moderate persistent asthma with (acute) exacerbation: Secondary | ICD-10-CM | POA: Insufficient documentation

## 2016-08-02 DIAGNOSIS — F17210 Nicotine dependence, cigarettes, uncomplicated: Secondary | ICD-10-CM | POA: Insufficient documentation

## 2016-08-02 LAB — BASIC METABOLIC PANEL
ANION GAP: 8 (ref 5–15)
BUN: 14 mg/dL (ref 6–20)
CALCIUM: 9.7 mg/dL (ref 8.9–10.3)
CO2: 22 mmol/L (ref 22–32)
CREATININE: 1.25 mg/dL — AB (ref 0.61–1.24)
Chloride: 108 mmol/L (ref 101–111)
Glucose, Bld: 95 mg/dL (ref 65–99)
Potassium: 3.6 mmol/L (ref 3.5–5.1)
SODIUM: 138 mmol/L (ref 135–145)

## 2016-08-02 LAB — CBC WITH DIFFERENTIAL/PLATELET
BASOS ABS: 0 10*3/uL (ref 0.0–0.1)
Basophils Relative: 1 %
EOS PCT: 6 %
Eosinophils Absolute: 0.3 10*3/uL (ref 0.0–0.7)
HEMATOCRIT: 40.7 % (ref 39.0–52.0)
Hemoglobin: 13.7 g/dL (ref 13.0–17.0)
Lymphocytes Relative: 50 %
Lymphs Abs: 2.7 10*3/uL (ref 0.7–4.0)
MCH: 29.1 pg (ref 26.0–34.0)
MCHC: 33.7 g/dL (ref 30.0–36.0)
MCV: 86.6 fL (ref 78.0–100.0)
MONO ABS: 0.3 10*3/uL (ref 0.1–1.0)
MONOS PCT: 5 %
NEUTROS ABS: 2 10*3/uL (ref 1.7–7.7)
Neutrophils Relative %: 38 %
Platelets: 245 10*3/uL (ref 150–400)
RBC: 4.7 MIL/uL (ref 4.22–5.81)
RDW: 13.1 % (ref 11.5–15.5)
WBC: 5.3 10*3/uL (ref 4.0–10.5)

## 2016-08-02 MED ORDER — FLUTICASONE PROPIONATE HFA 110 MCG/ACT IN AERO: 1.0000 | INHALATION_SPRAY | Freq: Two times a day (BID) | RESPIRATORY_TRACT | 0 refills | Status: DC

## 2016-08-02 MED ORDER — ALBUTEROL SULFATE HFA 108 (90 BASE) MCG/ACT IN AERS
2.0000 | INHALATION_SPRAY | RESPIRATORY_TRACT | Status: DC
  Administered 2016-08-02: 2 via RESPIRATORY_TRACT
  Filled 2016-08-02: qty 6.7

## 2016-08-02 MED ORDER — ALBUTEROL (5 MG/ML) CONTINUOUS INHALATION SOLN
10.0000 mg/h | INHALATION_SOLUTION | RESPIRATORY_TRACT | Status: AC
  Administered 2016-08-02: 10 mg/h via RESPIRATORY_TRACT
  Filled 2016-08-02: qty 20

## 2016-08-02 MED ORDER — IPRATROPIUM BROMIDE 0.02 % IN SOLN
0.5000 mg | Freq: Once | RESPIRATORY_TRACT | Status: AC
  Administered 2016-08-02: 0.5 mg via RESPIRATORY_TRACT
  Filled 2016-08-02: qty 2.5

## 2016-08-02 MED ORDER — SODIUM CHLORIDE 0.9 % IV BOLUS (SEPSIS)
1000.0000 mL | Freq: Once | INTRAVENOUS | Status: AC
  Administered 2016-08-02: 1000 mL via INTRAVENOUS

## 2016-08-02 MED ORDER — METHYLPREDNISOLONE SODIUM SUCC 125 MG IJ SOLR
125.0000 mg | Freq: Once | INTRAMUSCULAR | Status: AC
  Administered 2016-08-02: 125 mg via INTRAVENOUS
  Filled 2016-08-02: qty 2

## 2016-08-02 MED ORDER — KETOROLAC TROMETHAMINE 30 MG/ML IJ SOLN
15.0000 mg | Freq: Once | INTRAMUSCULAR | Status: AC
  Administered 2016-08-02: 15 mg via INTRAVENOUS
  Filled 2016-08-02: qty 1

## 2016-08-02 MED ORDER — PREDNISONE 20 MG PO TABS: 40.0000 mg | ORAL_TABLET | Freq: Every day | ORAL | 0 refills | Status: DC

## 2016-08-02 MED ORDER — MAGNESIUM SULFATE 2 GM/50ML IV SOLN
2.0000 g | INTRAVENOUS | Status: AC
  Administered 2016-08-02: 2 g via INTRAVENOUS
  Filled 2016-08-02: qty 50

## 2016-08-02 MED ORDER — ALBUTEROL SULFATE HFA 108 (90 BASE) MCG/ACT IN AERS: 2.0000 | INHALATION_SPRAY | Freq: Four times a day (QID) | RESPIRATORY_TRACT | 0 refills | Status: DC | PRN

## 2016-08-02 NOTE — ED Triage Notes (Signed)
Asthmatic that has ran out of medication Wednesday. Normally well controlled. 2030 SHOB and called EMS. 10 mg albuterol and 0.5 Atrovent.

## 2016-08-02 NOTE — ED Notes (Signed)
Pt understood dc material. NAD Noted 

## 2016-08-02 NOTE — ED Notes (Signed)
Pt's O2 stayed at 95-96 while ambulating. Pt reports feeling "short-winded"

## 2016-08-02 NOTE — Discharge Instructions (Signed)
Take Flovent as prescribed as well as prednisone. You may use an albuterol inhaler as prescribed for persistent shortness of breath. Follow-up with a primary care doctor regarding your visit today. You may return to the emergency department for new or concerning symptoms.

## 2016-08-02 NOTE — ED Provider Notes (Signed)
[facility]-EMERGENCY DEPT Provider Note   CSN: [id number] Arrival date & time: 08/02/16  0128    History   Chief Complaint Chief Complaint  Patient presents with  . Shortness of Breath    HPI Luis HitchReginald A Kochanski Jr. is a 51 y.o. male.   51 year old male with history of asthma and tobacco use presents to the emergency department for evaluation of shortness of breath. He reports worsening shortness of breath this morning after an asthma attack. He called EMS this morning who responded and administered a breathing treatment. Patient had worsening shortness of breath, again, his way home work [employer]. He states that he ran out of his albuterol inhaler 4 days ago. He denies history of seasonal allergies as well as recent fever. No syncope or near-syncope. He states that his symptoms today feel consistent with his asthma exacerbations. Patient has history of hospitalizations secondary to asthma exacerbation, last in January 2017.     Past Medical History:  Diagnosis Date  . Asthma   . Tobacco use     Patient Active Problem List   Diagnosis Date Noted  . Pleuritic chest pain 04/10/2015  . Asthma exacerbation 09/02/2013  . Cigarette smoker one half pack a day or less 09/02/2013  . Tachycardia 09/02/2013    History reviewed. No pertinent surgical history.     Home Medications    Prior to Admission medications   Medication Sig Start Date End Date Taking? Authorizing Provider  albuterol (PROVENTIL HFA;VENTOLIN HFA) 108 (90 Base) MCG/ACT inhaler Inhale 2 puffs into the lungs every 6 (six) hours as needed for wheezing. 08/02/16 01/22/20  Antony MaduraHumes, Standley Bargo, PA-C  amoxicillin (AMOXIL) 500 MG capsule Take 1 capsule (500 mg total) by mouth 3 (three) times daily. 07/17/15   Bethann BerkshireZammit, Joseph, MD  azithromycin (ZITHROMAX) 500 MG tablet Take 1 tablet (500 mg total) by mouth daily. Patient not taking: Reported on 07/17/2015 04/13/15   Regalado, Jon BillingsBelkys A, MD  fluticasone (FLOVENT HFA) 110 MCG/ACT inhaler  Inhale 1 puff into the lungs 2 (two) times daily. 08/02/16   Antony MaduraHumes, Melbourne Jakubiak, PA-C  guaiFENesin (MUCINEX) 600 MG 12 hr tablet Take 1 tablet (600 mg total) by mouth 2 (two) times daily. Patient not taking: Reported on 07/17/2015 04/13/15   Hartley Barefootegalado, Belkys A, MD  ipratropium (ATROVENT HFA) 17 MCG/ACT inhaler Inhale 2 puffs into the lungs every 6 (six) hours as needed for wheezing. Patient not taking: Reported on 07/17/2015 04/13/15   Regalado, Jon BillingsBelkys A, MD  predniSONE (DELTASONE) 20 MG tablet Take 2 tablets (40 mg total) by mouth daily. 08/02/16   Antony MaduraHumes, Anurag Scarfo, PA-C    Family History Family History  Problem Relation Age of Onset  . Asthma Father   . Diabetes Unknown   . High blood pressure Unknown     Social History Social History  Substance Use Topics  . Smoking status: Current Every Day Smoker    Packs/day: 0.25    Years: 30.00    Types: Cigarettes  . Smokeless tobacco: Not on file  . Alcohol use 7.2 oz/week    12 Cans of beer per week     Comment: Drink on weekends about a sixpack per day     Allergies   Patient has no known allergies.   Review of Systems Review of Systems Ten systems reviewed and are negative for acute change, except as noted in the HPI.    Physical Exam Updated Vital Signs BP 131/86   Pulse 99   Resp 20   Ht 5\' 9"  (  1.753 m)   Wt 104.3 kg (230 lb)   SpO2 98%   BMI 33.97 kg/m   Physical Exam  Constitutional: He is oriented to person, place, and time. He appears well-developed and well-nourished. No distress.  Nontoxic and in NAD  HENT:  Head: Normocephalic and atraumatic.  Mouth/Throat: Oropharynx is clear and moist.  Eyes: Conjunctivae and EOM are normal. No scleral icterus.  Neck: Normal range of motion.  Cardiovascular: Normal rate, regular rhythm and intact distal pulses.   Pulmonary/Chest: Effort normal. No respiratory distress.  Decreased breath sounds throughout. Expiratory wheeze diffusely. No rales or rhonchi. Chest expansion symmetric.    Musculoskeletal: Normal range of motion.  Neurological: He is alert and oriented to person, place, and time. He exhibits normal muscle tone. Coordination normal.  GCS 15. Patient moving all extremities.  Skin: Skin is warm and dry. No rash noted. He is not diaphoretic. No erythema. No pallor.  Psychiatric: He has a normal mood and affect. His behavior is normal.  Nursing note and vitals reviewed.    ED Treatments / Results  Labs (all labs ordered are listed, but only abnormal results are displayed) Labs Reviewed  BASIC METABOLIC PANEL - Abnormal; Notable for the following:       Result Value   Creatinine, Ser 1.25 (*)    All other components within normal limits  CBC WITH DIFFERENTIAL/PLATELET    EKG  EKG Interpretation None       Radiology Dg Chest 2 View  Result Date: 08/02/2016 CLINICAL DATA:  Ran out of asthma medication.  Shortness of breath. EXAM: CHEST  2 VIEW COMPARISON:  Chest radiograph Jul 17, 2015 FINDINGS: Cardiomediastinal silhouette is normal. No pleural effusions or focal consolidations. Mild hyperinflation. Trachea projects midline and there is no pneumothorax. Soft tissue planes and included osseous structures are non-suspicious. Mild degenerative change of the thoracolumbar junction. IMPRESSION: Stable examination: Mild hyperinflation without focal consolidation. Electronically Signed   By: Awilda Metro M.D.   On: 08/02/2016 03:02    Procedures Procedures (including critical care time)  Medications Ordered in ED Medications  albuterol (PROVENTIL,VENTOLIN) solution continuous neb (10 mg/hr Nebulization New Bag/Given 08/02/16 0230)  albuterol (PROVENTIL HFA;VENTOLIN HFA) 108 (90 Base) MCG/ACT inhaler 2 puff (2 puffs Inhalation Given 08/02/16 0535)  magnesium sulfate IVPB 2 g 50 mL (0 g Intravenous Stopped 08/02/16 0238)  methylPREDNISolone sodium succinate (SOLU-MEDROL) 125 mg/2 mL injection 125 mg (125 mg Intravenous Given 08/02/16 0236)  sodium chloride  0.9 % bolus 1,000 mL (0 mLs Intravenous Stopped 08/02/16 0529)  ipratropium (ATROVENT) nebulizer solution 0.5 mg (0.5 mg Nebulization Given 08/02/16 0230)  ketorolac (TORADOL) 30 MG/ML injection 15 mg (15 mg Intravenous Given 08/02/16 0408)     Initial Impression / Assessment and Plan / ED Course  I have reviewed the triage vital signs and the nursing notes.  Pertinent labs & imaging results that were available during my care of the patient were reviewed by me and considered in my medical decision making (see chart for details).     4:03 AM Patient reassessed. He has been off of his continuous albuterol treatment for approximately 10 minutes. He is complaining of some mild chest discomfort. 15 mg IV Toradol ordered. Otherwise, patient states that he is feeling much better and has very little wheezing. Lungs grossly clear on repeat exam. No hypoxia on room air. Will monitor to ensure no rebound. Anticipate discharge if able to ambulate without hypoxia.  5:10 AM Patient ambulatory in the emergency department without  hypoxia. On repeat assessment, patient with clear lung sounds. No hypoxia. No signs of rebound. Patient does complain of some persistent mild chest tightness. I have explained that this will resolve with time. Patient given prescriptions for albuterol as well as Flovent. Primary care follow-up advised in return precautions given. Patient discharged in stable condition with no unaddressed concerns.   Final Clinical Impressions(s) / ED Diagnoses   Final diagnoses:  Moderate persistent asthma with exacerbation    New Prescriptions New Prescriptions   PREDNISONE (DELTASONE) 20 MG TABLET    Take 2 tablets (40 mg total) by mouth daily.     Antony Madura, PA-C 08/02/16 0540    Nicanor Alcon, April, MD 08/02/16 [phone]

## 2018-04-03 ENCOUNTER — Encounter (HOSPITAL_COMMUNITY): Payer: Self-pay | Admitting: Emergency Medicine

## 2018-04-03 ENCOUNTER — Emergency Department (HOSPITAL_COMMUNITY)
Admission: EM | Admit: 2018-04-03 | Discharge: 2018-04-03 | Disposition: A | Discharge status: Home / Self Care | Payer: Self-pay | Attending: Emergency Medicine | Admitting: Emergency Medicine

## 2018-04-03 ENCOUNTER — Other Ambulatory Visit: Payer: Self-pay

## 2018-04-03 DIAGNOSIS — H6121 Impacted cerumen, right ear: Secondary | ICD-10-CM

## 2018-04-03 DIAGNOSIS — H6123 Impacted cerumen, bilateral: Secondary | ICD-10-CM | POA: Insufficient documentation

## 2018-04-03 DIAGNOSIS — K029 Dental caries, unspecified: Secondary | ICD-10-CM

## 2018-04-03 DIAGNOSIS — F17210 Nicotine dependence, cigarettes, uncomplicated: Secondary | ICD-10-CM | POA: Insufficient documentation

## 2018-04-03 DIAGNOSIS — J45909 Unspecified asthma, uncomplicated: Secondary | ICD-10-CM | POA: Insufficient documentation

## 2018-04-03 DIAGNOSIS — Z79899 Other long term (current) drug therapy: Secondary | ICD-10-CM | POA: Insufficient documentation

## 2018-04-03 MED ORDER — CIPROFLOXACIN-DEXAMETHASONE 0.3-0.1 % OT SUSP: 4.0000 [drp] | Freq: Once | OTIC | Status: DC

## 2018-04-03 MED ORDER — DOCUSATE SODIUM 50 MG/5ML PO LIQD
1.0000 mL | Freq: Once | ORAL | Status: DC
  Filled 2018-04-03: qty 10

## 2018-04-03 MED ORDER — HYDROCODONE-ACETAMINOPHEN 5-325 MG PO TABS: 1.0000 | ORAL_TABLET | ORAL | 0 refills | Status: DC | PRN

## 2018-04-03 MED ORDER — AMOXICILLIN-POT CLAVULANATE 875-125 MG PO TABS: 1.0000 | ORAL_TABLET | Freq: Two times a day (BID) | ORAL | 0 refills | Status: AC

## 2018-04-03 MED ORDER — HYDROGEN PEROXIDE 3 % EX SOLN
CUTANEOUS | Status: AC
  Filled 2018-04-03: qty 473

## 2018-04-03 MED ORDER — LIDOCAINE VISCOUS HCL 2 % MT SOLN
15.0000 mL | Freq: Once | OROMUCOSAL | Status: DC
  Filled 2018-04-03: qty 15

## 2018-04-03 MED ORDER — HYDROCODONE-ACETAMINOPHEN 5-325 MG PO TABS
2.0000 | ORAL_TABLET | Freq: Once | ORAL | Status: AC
  Administered 2018-04-03: 2 via ORAL
  Filled 2018-04-03: qty 2

## 2018-04-03 NOTE — ED Triage Notes (Signed)
Pt BIBA w/ c/o right sided facial pain x3 days, increased with eating.  Reports pain radiates to right temporal/ear area.  Pt reports trying ibuprofen for pain, but was ineffective.

## 2018-04-03 NOTE — ED Provider Notes (Signed)
[facility]-EMERGENCY DEPT Provider Note   CSN: [id number] Arrival date & time: 04/03/18  0759     History   Chief Complaint Chief Complaint  Patient presents with  . Dental Pain    HPI Luis Morand. is a 53 y.o. adult.  HPI   52 yo M with h/o asthma here with right sided facial pain. Pt reports that for the past 3 days, he has had gradual onset of severe R jaw and ear pain. Pain has been recurrent in past, but has been constant x 3 days. Pain is an aching, intermittently sharp R sided facial pain that is on his upper jaw/medial ear. It is made worse with eating or drinking, though he denies any overt tooth pain. No fever or chills. He also endorses sensation of aural fullness, decreased hearing in R>L ears. He has a h/o heavy wax production and uses ear buds regularly. No HA. No neck pain or neck stiffness. No fever or chills. No tinnitus. No swelling. No radiation down the neck. No specific alleviating factors - has not improved much w/ ibuprofen at home.  Past Medical History:  Diagnosis Date  . Asthma   . Tobacco use     Patient Active Problem List   Diagnosis Date Noted  . Pleuritic chest pain 04/10/2015  . Asthma exacerbation 09/02/2013  . Cigarette smoker one half pack a day or less 09/02/2013  . Tachycardia 09/02/2013    No past surgical history on file.      Home Medications    Prior to Admission medications   Medication Sig Start Date End Date Taking? Authorizing Provider  albuterol (PROVENTIL HFA;VENTOLIN HFA) 108 (90 Base) MCG/ACT inhaler Inhale 2 puffs into the lungs every 6 (six) hours as needed for wheezing. 08/02/16 01/22/20  Antony Madura, PA-C  amoxicillin (AMOXIL) 500 MG capsule Take 1 capsule (500 mg total) by mouth 3 (three) times daily. 07/17/15   Bethann Berkshire, MD  azithromycin (ZITHROMAX) 500 MG tablet Take 1 tablet (500 mg total) by mouth daily. Patient not taking: Reported on 07/17/2015 04/13/15   Regalado, Jon Billings A,  MD  fluticasone (FLOVENT HFA) 110 MCG/ACT inhaler Inhale 1 puff into the lungs 2 (two) times daily. 08/02/16   Antony Madura, PA-C  guaiFENesin (MUCINEX) 600 MG 12 hr tablet Take 1 tablet (600 mg total) by mouth 2 (two) times daily. Patient not taking: Reported on 07/17/2015 04/13/15   Hartley Barefoot A, MD  ipratropium (ATROVENT HFA) 17 MCG/ACT inhaler Inhale 2 puffs into the lungs every 6 (six) hours as needed for wheezing. Patient not taking: Reported on 07/17/2015 04/13/15   Regalado, Jon Billings A, MD  predniSONE (DELTASONE) 20 MG tablet Take 2 tablets (40 mg total) by mouth daily. 08/02/16   Antony Madura, PA-C    Family History Family History  Problem Relation Age of Onset  . Asthma Father   . Diabetes Other   . High blood pressure Other     Social History Social History   Tobacco Use  . Smoking status: Current Every Day Smoker    Packs/day: 0.25    Years: 30.00    Pack years: 7.50    Types: Cigarettes  Substance Use Topics  . Alcohol use: Yes    Alcohol/week: 12.0 standard drinks    Types: 12 Cans of beer per week    Comment: Drink on weekends about a sixpack per day  . Drug use: Yes    Types: Marijuana     Allergies  Patient has no known allergies.   Review of Systems Review of Systems  Constitutional: Negative for chills, fatigue and fever.  HENT: Positive for dental problem and ear pain. Negative for congestion and rhinorrhea.   Eyes: Negative for visual disturbance.  Respiratory: Negative for cough, shortness of breath and wheezing.   Cardiovascular: Negative for chest pain and leg swelling.  Gastrointestinal: Negative for abdominal pain, diarrhea, nausea and vomiting.  Genitourinary: Negative for dysuria and flank pain.  Musculoskeletal: Negative for neck pain and neck stiffness.  Skin: Negative for rash and wound.  Allergic/Immunologic: Negative for immunocompromised state.  Neurological: Negative for syncope, weakness and headaches.  All other systems reviewed  and are negative.    Physical Exam Updated Vital Signs BP (!) 168/97 (BP Location: Left Arm)   Pulse 94   Temp 98 F (36.7 C) (Oral)   Resp 18   SpO2 100%   Physical Exam Vitals signs and nursing note reviewed.  Constitutional:      General: She is not in acute distress.    Appearance: She is well-developed.  HENT:     Head: Normocephalic and atraumatic.     Right Ear: A middle ear effusion is present. There is impacted cerumen. Tympanic membrane is not erythematous or bulging.     Left Ear: There is impacted cerumen. Tympanic membrane is not erythematous or bulging.     Mouth/Throat:     Dentition: Abnormal dentition. Dental tenderness (Right upper molars) present.  Eyes:     Conjunctiva/sclera: Conjunctivae normal.  Neck:     Musculoskeletal: Neck supple.  Cardiovascular:     Rate and Rhythm: Normal rate and regular rhythm.     Heart sounds: Normal heart sounds. No murmur. No friction rub.  Pulmonary:     Effort: Pulmonary effort is normal. No respiratory distress.     Breath sounds: Normal breath sounds. No wheezing or rales.  Abdominal:     General: There is no distension.     Palpations: Abdomen is soft.     Tenderness: There is no abdominal tenderness.  Skin:    General: Skin is warm.     Capillary Refill: Capillary refill takes less than 2 seconds.  Neurological:     Mental Status: She is alert and oriented to person, place, and time.     Motor: No abnormal muscle tone.      ED Treatments / Results  Labs (all labs ordered are listed, but only abnormal results are displayed) Labs Reviewed - No data to display  EKG None  Radiology No results found.  Procedures .Ear Cerumen Removal Date/Time: 04/03/2018 8:55 AM Performed by: Shaune PollackIsaacs, Dalaney Needle, MD Authorized by: Shaune PollackIsaacs, Nikolus Marczak, MD   Consent:    Consent obtained:  Verbal   Consent given by:  Patient   Risks discussed:  Bleeding, dizziness, incomplete removal, infection, pain and TM perforation    Alternatives discussed:  Alternative treatment Procedure details:    Location:  R ear and L ear   Procedure type: curette   Post-procedure details:    Inspection:  Macerated skin   Hearing quality:  Improved   Patient tolerance of procedure:  Tolerated well, no immediate complications   (including critical care time)  Medications Ordered in ED Medications  lidocaine (XYLOCAINE) 2 % viscous mouth solution 15 mL (has no administration in time range)  HYDROcodone-acetaminophen (NORCO/VICODIN) 5-325 MG per tablet 2 tablet (has no administration in time range)  hydrogen peroxide 3 % external solution (has no administration in time  range)  ciprofloxacin-dexamethasone (CIPRODEX) 0.3-0.1 % OTIC (EAR) suspension 4 drop (has no administration in time range)     Initial Impression / Assessment and Plan / ED Course  I have reviewed the triage vital signs and the nursing notes.  Pertinent labs & imaging results that were available during my care of the patient were reviewed by me and considered in my medical decision making (see chart for details).     53 yo M here with R ear/jaw pain. Exam shows poor dentition with TTP over right upper premolars, but also significant R ear impaction w/ macerated external skin. I suspect his pain is primarily 2/2 dental source, though early OE from impaction is also a consideration. No signs of mastoiditis. He is afebrile, well appearing and o/w in NAD. Cerumen disimpacted then colace applied for cerumenolytic. Will start on drops, Augmentin for dental infection/ear infection, and refer to dentistry.  Final Clinical Impressions(s) / ED Diagnoses   Final diagnoses:  Impacted cerumen, right ear  Dental caries  Pain due to dental caries    ED Discharge Orders    None       Shaune Pollack, MD 04/03/18 [phone]

## 2018-04-03 NOTE — ED Notes (Signed)
Bed: WA01 Expected date:  Expected time:  Means of arrival:  Comments: Facial Pain, headache

## 2021-03-07 ENCOUNTER — Other Ambulatory Visit: Payer: Self-pay

## 2021-03-07 ENCOUNTER — Encounter (HOSPITAL_COMMUNITY): Payer: Self-pay | Admitting: *Deleted

## 2021-03-07 ENCOUNTER — Inpatient Hospital Stay (HOSPITAL_COMMUNITY)
Admission: EM | Admit: 2021-03-07 | Discharge: 2021-03-10 | DRG: 203 | Disposition: A | Discharge status: Home / Self Care | Payer: Self-pay | Attending: Internal Medicine | Admitting: Internal Medicine

## 2021-03-07 ENCOUNTER — Emergency Department (HOSPITAL_COMMUNITY): Payer: Self-pay

## 2021-03-07 DIAGNOSIS — F17210 Nicotine dependence, cigarettes, uncomplicated: Secondary | ICD-10-CM | POA: Diagnosis present

## 2021-03-07 DIAGNOSIS — Z20822 Contact with and (suspected) exposure to covid-19: Secondary | ICD-10-CM | POA: Diagnosis present

## 2021-03-07 DIAGNOSIS — J45901 Unspecified asthma with (acute) exacerbation: Secondary | ICD-10-CM | POA: Diagnosis present

## 2021-03-07 DIAGNOSIS — Z825 Family history of asthma and other chronic lower respiratory diseases: Secondary | ICD-10-CM

## 2021-03-07 DIAGNOSIS — J4541 Moderate persistent asthma with (acute) exacerbation: Principal | ICD-10-CM | POA: Diagnosis present

## 2021-03-07 DIAGNOSIS — Z833 Family history of diabetes mellitus: Secondary | ICD-10-CM

## 2021-03-07 LAB — BASIC METABOLIC PANEL
Anion gap: 7 (ref 5–15)
BUN: 11 mg/dL (ref 6–20)
CO2: 22 mmol/L (ref 22–32)
Calcium: 10.6 mg/dL — ABNORMAL HIGH (ref 8.9–10.3)
Chloride: 104 mmol/L (ref 98–111)
Creatinine, Ser: 0.97 mg/dL (ref 0.61–1.24)
GFR, Estimated: >60 mL/min (ref >60)
Glucose, Bld: 109 mg/dL — ABNORMAL HIGH (ref 70–99)
Potassium: 4.2 mmol/L (ref 3.5–5.1)
Sodium: 133 mmol/L — ABNORMAL LOW (ref 135–145)

## 2021-03-07 LAB — CBC
HCT: 41.6 % (ref 39.0–52.0)
Hemoglobin: 13.9 g/dL (ref 13.0–17.0)
MCH: 29.8 pg (ref 26.0–34.0)
MCHC: 33.4 g/dL (ref 30.0–36.0)
MCV: 89.1 fL (ref 80.0–100.0)
Platelets: 284 10*3/uL (ref 150–400)
RBC: 4.67 MIL/uL (ref 4.22–5.81)
RDW: 12.8 % (ref 11.5–15.5)
WBC: 7.4 10*3/uL (ref 4.0–10.5)
nRBC: 0 % (ref 0.0–0.2)

## 2021-03-07 MED ORDER — ALBUTEROL SULFATE HFA 108 (90 BASE) MCG/ACT IN AERS: 2.0000 | INHALATION_SPRAY | RESPIRATORY_TRACT | Status: DC | PRN

## 2021-03-07 MED ORDER — ALBUTEROL SULFATE (2.5 MG/3ML) 0.083% IN NEBU
12.0000 mL | INHALATION_SOLUTION | Freq: Once | RESPIRATORY_TRACT | Status: DC
  Filled 2021-03-07: qty 18

## 2021-03-07 MED ORDER — MAGNESIUM SULFATE 2 GM/50ML IV SOLN
2.0000 g | Freq: Once | INTRAVENOUS | Status: AC
  Administered 2021-03-07: 23:00:00 2 g via INTRAVENOUS
  Filled 2021-03-07: qty 50

## 2021-03-07 MED ORDER — METHYLPREDNISOLONE SODIUM SUCC 125 MG IJ SOLR
125.0000 mg | Freq: Once | INTRAMUSCULAR | Status: AC
  Administered 2021-03-07: 23:00:00 125 mg via INTRAVENOUS
  Filled 2021-03-07: qty 2

## 2021-03-07 MED ORDER — IPRATROPIUM BROMIDE 0.02 % IN SOLN
0.5000 mg | Freq: Once | RESPIRATORY_TRACT | Status: AC
  Administered 2021-03-08: 0.5 mg via RESPIRATORY_TRACT
  Filled 2021-03-07: qty 2.5

## 2021-03-07 MED ORDER — ALBUTEROL SULFATE (2.5 MG/3ML) 0.083% IN NEBU
15.0000 mg | INHALATION_SOLUTION | Freq: Once | RESPIRATORY_TRACT | Status: AC
Start: 2021-03-07 — End: 2021-03-07
  Administered 2021-03-07: 23:00:00 15 mg via RESPIRATORY_TRACT

## 2021-03-07 NOTE — ED Triage Notes (Signed)
Pt arrives via [facility] from [employer] with c/o SOB. Started around 1630 after getting off work. Wheezing throughout 80-90's RA. 1 albuterol, no relief, 2nd albuterol going now. Ran out of inhaler 4 days ago. No IV access. 92 hr, 22rr, 144/84, 98% on treatment.

## 2021-03-07 NOTE — ED Provider Notes (Signed)
[facility] EMERGENCY DEPARTMENT Provider Note   CSN: [id number] Arrival date & time: 03/07/21  2220     History Chief Complaint  Patient presents with   Shortness of Breath    Luis Dever. is a 55 y.o. male with PMHx asthma who presents to the ED today via EMS with complaint of shortness of breath/asthma exacerbation.  Patient reports he has been out of his albuterol inhaler for the past 4 days.  He was getting off of work when he began having shortness of breath, cough, wheezing, chest tightness consistent with previous asthma exacerbations.  EMS patient was found to be 80 to 90% on room air.  He was provided with 2 rounds of albuterol treatment without significant improvement.  They were unable to obtain IV access and no other intervention was provided.  On arrival to the ED patient is 98% after treatment on room air.  He does report he has required admission in the past for asthma exacerbation however has never required intubation.  He has no other complaints at this time.   The history is provided by the patient and medical records.      Past Medical History:  Diagnosis Date   Asthma    Tobacco use     Patient Active Problem List   Diagnosis Date Noted   Pleuritic chest pain 04/10/2015   Asthma exacerbation 09/02/2013   Cigarette smoker one half pack a day or less 09/02/2013   Tachycardia 09/02/2013    History reviewed. No pertinent surgical history.     Family History  Problem Relation Age of Onset   Asthma Father    Diabetes Other    High blood pressure Other     Social History   Tobacco Use   Smoking status: Every Day    Packs/day: 0.25    Years: 30.00    Pack years: 7.50    Types: Cigarettes  Substance Use Topics   Alcohol use: Yes    Alcohol/week: 12.0 standard drinks    Types: 12 Cans of beer per week    Comment: Drink on weekends about a sixpack per day   Drug use: Yes    Types: Marijuana    Home Medications Prior  to Admission medications   Medication Sig Start Date End Date Taking? Authorizing Provider  albuterol (PROVENTIL HFA;VENTOLIN HFA) 108 (90 Base) MCG/ACT inhaler Inhale 2 puffs into the lungs every 6 (six) hours as needed for wheezing. 08/02/16 01/22/20  Antonietta Breach, PA-C  amoxicillin (AMOXIL) 500 MG capsule Take 1 capsule (500 mg total) by mouth 3 (three) times daily. 07/17/15   Milton Ferguson, MD  azithromycin (ZITHROMAX) 500 MG tablet Take 1 tablet (500 mg total) by mouth daily. Patient not taking: Reported on 07/17/2015 04/13/15   Regalado, Jerald Kief A, MD  fluticasone (FLOVENT HFA) 110 MCG/ACT inhaler Inhale 1 puff into the lungs 2 (two) times daily. 08/02/16   Antonietta Breach, PA-C  guaiFENesin (MUCINEX) 600 MG 12 hr tablet Take 1 tablet (600 mg total) by mouth 2 (two) times daily. Patient not taking: Reported on 07/17/2015 04/13/15   Niel Hummer A, MD  HYDROcodone-acetaminophen (NORCO/VICODIN) 5-325 MG tablet Take 1 tablet by mouth every 4 (four) hours as needed. 04/03/18   Duffy Bruce, MD  ipratropium (ATROVENT HFA) 17 MCG/ACT inhaler Inhale 2 puffs into the lungs every 6 (six) hours as needed for wheezing. Patient not taking: Reported on 07/17/2015 04/13/15   Regalado, Jerald Kief A, MD  predniSONE (DELTASONE) 20 MG  tablet Take 2 tablets (40 mg total) by mouth daily. 08/02/16   Antony Madura, PA-C    Allergies    Patient has no known allergies.  Review of Systems   Review of Systems  Constitutional:  Negative for chills and fever.  Respiratory:  Positive for cough, chest tightness, shortness of breath and wheezing.   Cardiovascular:  Negative for chest pain, palpitations and leg swelling.  Gastrointestinal:  Negative for nausea and vomiting.  Neurological:  Negative for headaches.  All other systems reviewed and are negative.  Physical Exam Updated Vital Signs BP (!) 142/86    Pulse (!) 102    Temp (!) 97.4 F (36.3 C) (Axillary)    Resp 14    Ht 5\' 9"  (1.753 m)    Wt 90.7 kg    SpO2 100%     BMI 29.53 kg/m   Physical Exam Vitals and nursing note reviewed.  Constitutional:      Appearance: He is not ill-appearing or diaphoretic.  HENT:     Head: Normocephalic and atraumatic.  Eyes:     Conjunctiva/sclera: Conjunctivae normal.  Cardiovascular:     Rate and Rhythm: Regular rhythm. Tachycardia present.  Pulmonary:     Effort: Tachypnea present.     Breath sounds: Decreased breath sounds and wheezing present.  Abdominal:     Palpations: Abdomen is soft.     Tenderness: There is no abdominal tenderness.  Musculoskeletal:     Cervical back: Neck supple.     Right lower leg: No edema.     Left lower leg: No edema.  Skin:    General: Skin is warm and dry.  Neurological:     Mental Status: He is alert.    ED Results / Procedures / Treatments   Labs (all labs ordered are listed, but only abnormal results are displayed) Labs Reviewed  BASIC METABOLIC PANEL - Abnormal; Notable for the following components:      Result Value   Sodium 133 (*)    Glucose, Bld 109 (*)    Calcium 10.6 (*)    All other components within normal limits  CBC    EKG EKG Interpretation  Date/Time:  Friday March 07 2021 22:28:00 EST Ventricular Rate:  96 PR Interval:  154 QRS Duration: 91 QT Interval:  335 QTC Calculation: 424 R Axis:   77 Text Interpretation: Sinus rhythm Probable anteroseptal infarct, old poor baseline, unchanged from previous Confirmed by 02-23-1997 [phone]) on 03/07/2021 10:34:21 PM  Radiology DG Chest Portable 1 View  Result Date: 03/07/2021 CLINICAL DATA:  sob EXAM: PORTABLE CHEST 1 VIEW COMPARISON:  Chest x-ray 08/02/2016 FINDINGS: The heart and mediastinal contours are within normal limits. No focal consolidation. No pulmonary edema. No pleural effusion. No pneumothorax. No acute osseous abnormality. IMPRESSION: No active disease. Electronically Signed   By: 08/04/2016 M.D.   On: 03/07/2021 22:54    Procedures Procedures   Medications Ordered in  ED Medications  albuterol (VENTOLIN HFA) 108 (90 Base) MCG/ACT inhaler 2 puff (has no administration in time range)  magnesium sulfate IVPB 2 g 50 mL (2 g Intravenous New Bag/Given 03/07/21 2245)  ipratropium (ATROVENT) nebulizer solution 0.5 mg (has no administration in time range)  methylPREDNISolone sodium succinate (SOLU-MEDROL) 125 mg/2 mL injection 125 mg (125 mg Intravenous Given 03/07/21 2245)  albuterol (PROVENTIL) (2.5 MG/3ML) 0.083% nebulizer solution 15 mg (15 mg Nebulization Given 03/07/21 2256)    ED Course  I have reviewed the triage vital signs and  the nursing notes.  Pertinent labs & imaging results that were available during my care of the patient were reviewed by me and considered in my medical decision making (see chart for details).    MDM Rules/Calculators/A&P                          This patient presents to the ED for concern of asthma exacerbation/SOB, this involves an extensive number of treatment options, and is a complaint that carries with it a high risk of complications and morbidity.    Co morbidities that complicate the patient evaluation Asthma, tobacco abuse  Lab Tests: I Ordered, and personally interpreted labs.  The pertinent results include:  labs unremarkable at this time. Ordered preemptively incase patient requires admission.    Imaging Studies ordered: I ordered imaging studies including CXR I independently visualized and interpreted imaging which showed no acute findings.  I agree with the radiologist interpretation   Cardiac Monitoring: The patient was maintained on a cardiac monitor.  I personally viewed and interpreted the cardiac monitored which showed an underlying rhythm of: NSR without acute ischemic changes   Medicines ordered and prescription drug management: I ordered medication including solumedrol, magnesium, and continuous albuterol neb  for wheezing Reevaluation of the patient after these medicines showed that the patient  improved slightly however still wheezing on exam. Will provide ipratropium and reeveluate.  I have reviewed the patients home medicines and have made adjustments as needed   Test Considered: N/A   Critical Interventions: N/A   Problem List / ED Course: Asthma exacerbation    Dispostion: At shift change case signed out to oncoming physician Dr. Sedonia Small who will evaluate patient after additional nebulizer treatment and determine disposition. Pt may require admission.      Final Clinical Impression(s) / ED Diagnoses Final diagnoses:  Moderate persistent asthma with exacerbation    Rx / DC Orders ED Discharge Orders     None        Luis Maize, PA-C 03/07/21 2345    Drenda Freeze, MD 03/10/21 1455

## 2021-03-07 NOTE — ED Provider Notes (Signed)
°  Provider Note MRN:  [id number]  Arrival date & time: 03/08/21    ED Course and Medical Decision Making  Assumed care from Dr. Silverio Lay at shift change.  Asthma exacerbation, will need reassessment after medications to determine need for admission versus discharge.  Patient continues to have diffuse wheezing, did not do well with ambulation testing, mild hypoxia and very symptomatic.  Admitted to medicine for further care.  Procedures  Final Clinical Impressions(s) / ED Diagnoses     ICD-10-CM   1. Moderate persistent asthma with exacerbation  J45.41       ED Discharge Orders     None       Discharge Instructions   None     Elmer Sow. Pilar Plate, MD [facility] Emergency Medicine [facility] [email or web address]    Sabas Sous, MD 03/08/21 [id number]

## 2021-03-08 ENCOUNTER — Encounter (HOSPITAL_COMMUNITY): Payer: Self-pay | Admitting: Family Medicine

## 2021-03-08 DIAGNOSIS — J45901 Unspecified asthma with (acute) exacerbation: Secondary | ICD-10-CM

## 2021-03-08 LAB — RESP PANEL BY RT-PCR (FLU A&B, COVID) ARPGX2
Influenza A by PCR: NEGATIVE
Influenza B by PCR: NEGATIVE
SARS Coronavirus 2 by RT PCR: NEGATIVE

## 2021-03-08 MED ORDER — ALBUTEROL SULFATE (2.5 MG/3ML) 0.083% IN NEBU
2.5000 mg | INHALATION_SOLUTION | RESPIRATORY_TRACT | Status: DC | PRN
  Administered 2021-03-08: 2.5 mg via RESPIRATORY_TRACT
  Filled 2021-03-08: qty 3

## 2021-03-08 MED ORDER — ACETAMINOPHEN 650 MG RE SUPP: 650.0000 mg | Freq: Four times a day (QID) | RECTAL | Status: DC | PRN

## 2021-03-08 MED ORDER — IPRATROPIUM-ALBUTEROL 0.5-2.5 (3) MG/3ML IN SOLN
3.0000 mL | Freq: Four times a day (QID) | RESPIRATORY_TRACT | Status: DC
  Administered 2021-03-09 (×4): 3 mL via RESPIRATORY_TRACT
  Filled 2021-03-08 (×4): qty 3

## 2021-03-08 MED ORDER — METHYLPREDNISOLONE SODIUM SUCC 125 MG IJ SOLR
125.0000 mg | Freq: Two times a day (BID) | INTRAMUSCULAR | Status: DC
  Administered 2021-03-08 – 2021-03-09 (×3): 125 mg via INTRAVENOUS
  Filled 2021-03-08 (×3): qty 2

## 2021-03-08 MED ORDER — ENOXAPARIN SODIUM 40 MG/0.4ML IJ SOSY
40.0000 mg | PREFILLED_SYRINGE | Freq: Every day | INTRAMUSCULAR | Status: DC
  Administered 2021-03-08 – 2021-03-10 (×3): 40 mg via SUBCUTANEOUS
  Filled 2021-03-08 (×3): qty 0.4

## 2021-03-08 MED ORDER — PANTOPRAZOLE SODIUM 40 MG PO TBEC
40.0000 mg | DELAYED_RELEASE_TABLET | Freq: Every day | ORAL | Status: DC
  Administered 2021-03-08 – 2021-03-10 (×3): 40 mg via ORAL
  Filled 2021-03-08 (×3): qty 1

## 2021-03-08 MED ORDER — ACETAMINOPHEN 325 MG PO TABS: 650.0000 mg | ORAL_TABLET | Freq: Four times a day (QID) | ORAL | Status: DC | PRN

## 2021-03-08 MED ORDER — IPRATROPIUM-ALBUTEROL 0.5-2.5 (3) MG/3ML IN SOLN
3.0000 mL | Freq: Four times a day (QID) | RESPIRATORY_TRACT | Status: DC
  Administered 2021-03-08 (×4): 3 mL via RESPIRATORY_TRACT
  Filled 2021-03-08 (×4): qty 3

## 2021-03-08 NOTE — Progress Notes (Signed)
Pt admitted to 6n20 via stretcher from ED. Ambulated to bed-gait steady. Denies SOB/ CP at this time. Will continue to monitor.

## 2021-03-08 NOTE — ED Notes (Signed)
Pt NAD in bed, breathing even and unlabored. A/ox4, speaking in full and complete sentences. Pt states he has asthma and had a bad attack on the way home from work. Pt states he has about 1 of these per year but this has been much worse because he was out of his albuterol. Pt has expiratory wheezes throughout. Denies CP, SOB at this time. -edema. VSS

## 2021-03-08 NOTE — H&P (Signed)
History and Physical    [name]. WLN:[id number] DOB: 14-Dec-1965 DOA: 03/07/2021  PCP: Pcp, No   Patient coming from: Home   Chief Complaint: Wheezing, SOB, cough   HPI: Anthonio Mizzell. is a pleasant 55 y.o. male with medical history significant for asthma, now presenting to the emergency department for evaluation of shortness of breath, cough, and wheezing.  The patient reports that he had some rhinorrhea a couple days ago but otherwise felt well until 03/07/2021 when he developed nonproductive cough, wheezing, and shortness of breath.  Symptoms continue to progress and he eventually called EMS.  He was said to be saturating in the 80s to low 90s on room air with EMS and was given 2 albuterol treatments prior to arrival in the ED.  Patient denies fevers, chills, chest pain, or leg swelling.  He reports that symptoms are consistent with his prior asthma exacerbations.  He states that he has had to be hospitalized multiple times for this but not in the past couple years, and never intubated.  ED Course: Upon arrival to the ED, patient is found to be afebrile, saturating low 90s on room air, slightly tachypneic and tachycardic, with stable blood pressure.  EKG features sinus rhythm and chest x-ray negative for acute cardiopulmonary disease.  Chemistry panel with mild hyponatremia and CBC unremarkable.  Patient was given continuous albuterol treatment, IV magnesium, IV Solu-Medrol, and Atrovent in the ED.  Review of Systems:  All other systems reviewed and apart from HPI, are negative.  Past Medical History:  Diagnosis Date   Asthma    Tobacco use     History reviewed. No pertinent surgical history.  Social History:   reports that he has been smoking cigarettes. He has a 7.50 pack-year smoking history. He does not have any smokeless tobacco history on file. He reports current alcohol use of about 12.0 standard drinks per week. He reports current drug use. Drug:  Marijuana.  No Known Allergies  Family History  Problem Relation Age of Onset   Asthma Father    Diabetes Other    High blood pressure Other      Prior to Admission medications   Medication Sig Start Date End Date Taking? Authorizing Provider  albuterol (PROVENTIL HFA;VENTOLIN HFA) 108 (90 Base) MCG/ACT inhaler Inhale 2 puffs into the lungs every 6 (six) hours as needed for wheezing. 08/02/16 03/08/21 Yes Antony Madura, PA-C  amoxicillin (AMOXIL) 500 MG capsule Take 1 capsule (500 mg total) by mouth 3 (three) times daily. Patient not taking: Reported on 03/08/2021 07/17/15   Bethann Berkshire, MD  azithromycin (ZITHROMAX) 500 MG tablet Take 1 tablet (500 mg total) by mouth daily. Patient not taking: Reported on 07/17/2015 04/13/15   Regalado, Jon Billings A, MD  fluticasone (FLOVENT HFA) 110 MCG/ACT inhaler Inhale 1 puff into the lungs 2 (two) times daily. Patient not taking: Reported on 03/08/2021 08/02/16   Antony Madura, PA-C  guaiFENesin (MUCINEX) 600 MG 12 hr tablet Take 1 tablet (600 mg total) by mouth 2 (two) times daily. Patient not taking: Reported on 07/17/2015 04/13/15   Hartley Barefoot A, MD  HYDROcodone-acetaminophen (NORCO/VICODIN) 5-325 MG tablet Take 1 tablet by mouth every 4 (four) hours as needed. Patient not taking: Reported on 03/08/2021 04/03/18   Shaune Pollack, MD  ipratropium (ATROVENT HFA) 17 MCG/ACT inhaler Inhale 2 puffs into the lungs every 6 (six) hours as needed for wheezing. Patient not taking: Reported on 07/17/2015 04/13/15   Alba Cory, MD  predniSONE (  DELTASONE) 20 MG tablet Take 2 tablets (40 mg total) by mouth daily. Patient not taking: Reported on 03/08/2021 08/02/16   Antony Madura, PA-C    Physical Exam: Vitals:   03/08/21 0130 03/08/21 0200 03/08/21 0230 03/08/21 0500  BP: 137/83 137/82 (!) 146/76 130/81  Pulse: 87 84 93 90  Resp: (!) 21 19 17  (!) 22  Temp:      TempSrc:      SpO2: 91% 94% 94% 93%  Weight:      Height:        Constitutional: NAD,  calm  Eyes: PERTLA, lids and conjunctivae normal ENMT: Mucous membranes are moist. Posterior pharynx clear of any exudate or lesions.   Neck: supple, no masses  Respiratory: Prolonged expiratory phase with wheezes. Speaking full sentences but becomes dyspneic with speech.  Cardiovascular: S1 & S2 heard, regular rate and rhythm. No extremity edema.   Abdomen: No distension, no tenderness, soft. Bowel sounds active.  Musculoskeletal: no clubbing / cyanosis. No joint deformity upper and lower extremities.   Skin: no significant rashes, lesions, ulcers. Warm, dry, well-perfused. Neurologic: CN 2-12 grossly intact. Moving all extremities. Alert and oriented.  Psychiatric: Pleasant. Cooperative.    Labs and Imaging on Admission: I have personally reviewed following labs and imaging studies  CBC: Recent Labs  Lab 03/07/21 2234  WBC 7.4  HGB 13.9  HCT 41.6  MCV 89.1  PLT 284   Basic Metabolic Panel: Recent Labs  Lab 03/07/21 2234  NA 133*  K 4.2  CL 104  CO2 22  GLUCOSE 109*  BUN 11  CREATININE 0.97  CALCIUM 10.6*   GFR: Estimated Creatinine Clearance: 93.5 mL/min (by C-G formula based on SCr of 0.97 mg/dL). Liver Function Tests: No results for input(s): AST, ALT, ALKPHOS, BILITOT, PROT, ALBUMIN in the last 168 hours. No results for input(s): LIPASE, AMYLASE in the last 168 hours. No results for input(s): AMMONIA in the last 168 hours. Coagulation Profile: No results for input(s): INR, PROTIME in the last 168 hours. Cardiac Enzymes: No results for input(s): CKTOTAL, CKMB, CKMBINDEX, TROPONINI in the last 168 hours. BNP (last 3 results) No results for input(s): PROBNP in the last 8760 hours. HbA1C: No results for input(s): HGBA1C in the last 72 hours. CBG: No results for input(s): GLUCAP in the last 168 hours. Lipid Profile: No results for input(s): CHOL, HDL, LDLCALC, TRIG, CHOLHDL, LDLDIRECT in the last 72 hours. Thyroid Function Tests: No results for input(s): TSH,  T4TOTAL, FREET4, T3FREE, THYROIDAB in the last 72 hours. Anemia Panel: No results for input(s): VITAMINB12, FOLATE, FERRITIN, TIBC, IRON, RETICCTPCT in the last 72 hours. Urine analysis: No results found for: COLORURINE, APPEARANCEUR, LABSPEC, PHURINE, GLUCOSEU, HGBUR, BILIRUBINUR, KETONESUR, PROTEINUR, UROBILINOGEN, NITRITE, LEUKOCYTESUR Sepsis Labs: @LABRCNTIP (procalcitonin:4,lacticidven:4) ) Recent Results (from the past 240 hour(s))  Resp Panel by RT-PCR (Flu A&B, Covid) Nasopharyngeal Swab     Status: None   Collection Time: 03/08/21  2:32 AM   Specimen: Nasopharyngeal Swab; Nasopharyngeal(NP) swabs in vial transport medium  Result Value Ref Range Status   SARS Coronavirus 2 by RT PCR NEGATIVE NEGATIVE Final    Comment: (NOTE) SARS-CoV-2 target nucleic acids are NOT DETECTED.  The SARS-CoV-2 RNA is generally detectable in upper respiratory specimens during the acute phase of infection. The lowest concentration of SARS-CoV-2 viral copies this assay can detect is 138 copies/mL. A negative result does not preclude SARS-Cov-2 infection and should not be used as the sole basis for treatment or other patient management decisions. A negative  result may occur with  improper specimen collection/handling, submission of specimen other than nasopharyngeal swab, presence of viral mutation(s) within the areas targeted by this assay, and inadequate number of viral copies(<138 copies/mL). A negative result must be combined with clinical observations, patient history, and epidemiological information. The expected result is Negative.  Fact Sheet for Patients:  BloggerCourse.com  Fact Sheet for Healthcare Providers:  SeriousBroker.it  This test is no t yet approved or cleared by the [place] FDA and  has been authorized for detection and/or diagnosis of SARS-CoV-2 by FDA under an Emergency Use Authorization (EUA). This EUA will remain  in  effect (meaning this test can be used) for the duration of the COVID-19 declaration under Section 564(b)(1) of the Act, 21 U.S.C.section 360bbb-3(b)(1), unless the authorization is terminated  or revoked sooner.       Influenza A by PCR NEGATIVE NEGATIVE Final   Influenza B by PCR NEGATIVE NEGATIVE Final    Comment: (NOTE) The Xpert Xpress SARS-CoV-2/FLU/RSV plus assay is intended as an aid in the diagnosis of influenza from Nasopharyngeal swab specimens and should not be used as a sole basis for treatment. Nasal washings and aspirates are unacceptable for Xpert Xpress SARS-CoV-2/FLU/RSV testing.  Fact Sheet for Patients: BloggerCourse.com  Fact Sheet for Healthcare Providers: SeriousBroker.it  This test is not yet approved or cleared by the [place] FDA and has been authorized for detection and/or diagnosis of SARS-CoV-2 by FDA under an Emergency Use Authorization (EUA). This EUA will remain in effect (meaning this test can be used) for the duration of the COVID-19 declaration under Section 564(b)(1) of the Act, 21 U.S.C. section 360bbb-3(b)(1), unless the authorization is terminated or revoked.  Performed at [facility], [address], [place], [place] [address]      Radiological Exams on Admission: DG Chest Portable 1 View  Result Date: 03/07/2021 CLINICAL DATA:  sob EXAM: PORTABLE CHEST 1 VIEW COMPARISON:  Chest x-ray 08/02/2016 FINDINGS: The heart and mediastinal contours are within normal limits. No focal consolidation. No pulmonary edema. No pleural effusion. No pneumothorax. No acute osseous abnormality. IMPRESSION: No active disease. Electronically Signed   By: Tish Frederickson M.D.   On: 03/07/2021 22:54    EKG: Independently reviewed. Sinus rhythm.   Assessment/Plan   1. Asthma exacerbation  - Pt with asthma presents with one day of SOB, non-productive cough, and wheeze after recent rhinorrhea  -  Given multiple albuterol treatments with EMS, CAT, Atrovent, IV magnesium, and IV Solu-Medrol in ED but continues to be dyspneic at rest  - Likely precipitated by viral infection, COVID and influenza are negative  - Continue systemic steroids and albuterol    2. Tobacco abuse  - Patient has been planning to quit and plans to do that now    DVT prophylaxis: Lovenox  Code Status: Full  Level of Care: Level of care: Med-Surg Family Communication: None present  Disposition Plan:  Patient is from: Home  Anticipated d/c is to: Home  Anticipated d/c date is: possibly as early as 03/09/21  Patient currently: pending improvement in respiratory status  Consults called: none  Admission status: Observation     Briscoe Deutscher, MD [facility]  03/08/2021, 6:11 AM

## 2021-03-08 NOTE — Progress Notes (Signed)
PROGRESS NOTE    Luis Hitch.  ZYS:[id number] DOB: 1966-02-15 DOA: 03/07/2021 PCP: Pcp, No    Chief Complaint  Patient presents with   Shortness of Breath    Brief Narrative:   This is a no charge note as patient was seen and admitted earlier today by Dr. Antionette Char, chart, imaging and labs were reviewed, patient was seen and examined.  HPI: Luis Gay. is a pleasant 55 y.o. male with medical history significant for asthma, now presenting to the emergency department for evaluation of shortness of breath, cough, and wheezing.  The patient reports that he had some rhinorrhea a couple days ago but otherwise felt well until 03/07/2021 when he developed nonproductive cough, wheezing, and shortness of breath.  Symptoms continue to progress and he eventually called EMS.  He was said to be saturating in the 80s to low 90s on room air with EMS and was given 2 albuterol treatments prior to arrival in the ED.  Patient denies fevers, chills, chest pain, or leg swelling.  He reports that symptoms are consistent with his prior asthma exacerbations.  He states that he has had to be hospitalized multiple times for this but not in the past couple years, and never intubated.   ED Course: Upon arrival to the ED, patient is found to be afebrile, saturating low 90s on room air, slightly tachypneic and tachycardic, with stable blood pressure.  EKG features sinus rhythm and chest x-ray negative for acute cardiopulmonary disease.  Chemistry panel with mild hyponatremia and CBC unremarkable.  Patient was given continuous albuterol treatment, IV magnesium, IV Solu-Medrol, and Atrovent in the ED.    Assessment & Plan:   Principal Problem:   Asthma exacerbation  1. Asthma exacerbation  -Patient with known history of asthma exacerbation, who presents with dyspnea, cough, nonproductive, he remains with significant wheezing bilaterally, diffuse, with diminished air entry bilaterally despite being on  high-dose Solu-Medrol 125 mg IV twice daily, I will not taper any further today and will keep on current dose, I will start him on scheduled DuoNeb 4 times daily, continue with as needed albuterol as well. - Likely precipitated by viral infection, COVID and influenza are negative  - Continue systemic steroids and albuterol     2. Tobacco abuse  - Patient has been planning to quit and plans to do that now      DVT prophylaxis: Lovenox Code Status: Full Family Communication: Noe at bedside Disposition:   Status is: Observation  The patient will require care spanning > 2 midnights and should be moved to inpatient because: Means with significant wheezing and diminished air entry where he is requiring IV steroids diminished air entry     Consultants:  none   Subjective:  Still reports getting significant shortness of breath and difficulty breathing  Objective: Vitals:   03/08/21 0642 03/08/21 0700 03/08/21 0930 03/08/21 1115  BP:  (!) 135/95 123/70 128/70  Pulse:  99 87 92  Resp:   18 19  Temp: 98.1 F (36.7 C)     TempSrc: Oral     SpO2:  94% 94% 95%  Weight:      Height:       No intake or output data in the 24 hours ending 03/08/21 1320 Filed Weights   03/07/21 2224  Weight: 90.7 kg    Examination:  Awake Alert, Oriented X 3, No new F.N deficits, Normal affect Symmetrical Chest wall movement, patient remains with diffuse wheezing bilaterally and diminished  air entry RRR,No Gallops,Rubs or new Murmurs, No Parasternal Heave +ve B.Sounds, Abd Soft, No tenderness, No rebound - guarding or rigidity. No Cyanosis, Clubbing or edema, No new Rash or bruise   .     Data Reviewed: I have personally reviewed following labs and imaging studies  CBC: Recent Labs  Lab 03/07/21 2234  WBC 7.4  HGB 13.9  HCT 41.6  MCV 89.1  PLT 284    Basic Metabolic Panel: Recent Labs  Lab 03/07/21 2234  NA 133*  K 4.2  CL 104  CO2 22  GLUCOSE 109*  BUN 11  CREATININE  0.97  CALCIUM 10.6*    GFR: Estimated Creatinine Clearance: 93.5 mL/min (by C-G formula based on SCr of 0.97 mg/dL).  Liver Function Tests: No results for input(s): AST, ALT, ALKPHOS, BILITOT, PROT, ALBUMIN in the last 168 hours.  CBG: No results for input(s): GLUCAP in the last 168 hours.   Recent Results (from the past 240 hour(s))  Resp Panel by RT-PCR (Flu A&B, Covid) Nasopharyngeal Swab     Status: None   Collection Time: 03/08/21  2:32 AM   Specimen: Nasopharyngeal Swab; Nasopharyngeal(NP) swabs in vial transport medium  Result Value Ref Range Status   SARS Coronavirus 2 by RT PCR NEGATIVE NEGATIVE Final    Comment: (NOTE) SARS-CoV-2 target nucleic acids are NOT DETECTED.  The SARS-CoV-2 RNA is generally detectable in upper respiratory specimens during the acute phase of infection. The lowest concentration of SARS-CoV-2 viral copies this assay can detect is 138 copies/mL. A negative result does not preclude SARS-Cov-2 infection and should not be used as the sole basis for treatment or other patient management decisions. A negative result may occur with  improper specimen collection/handling, submission of specimen other than nasopharyngeal swab, presence of viral mutation(s) within the areas targeted by this assay, and inadequate number of viral copies(<138 copies/mL). A negative result must be combined with clinical observations, patient history, and epidemiological information. The expected result is Negative.  Fact Sheet for Patients:  BloggerCourse.com  Fact Sheet for Healthcare Providers:  SeriousBroker.it  This test is no t yet approved or cleared by the [place] FDA and  has been authorized for detection and/or diagnosis of SARS-CoV-2 by FDA under an Emergency Use Authorization (EUA). This EUA will remain  in effect (meaning this test can be used) for the duration of the COVID-19 declaration under Section  564(b)(1) of the Act, 21 U.S.C.section 360bbb-3(b)(1), unless the authorization is terminated  or revoked sooner.       Influenza A by PCR NEGATIVE NEGATIVE Final   Influenza B by PCR NEGATIVE NEGATIVE Final    Comment: (NOTE) The Xpert Xpress SARS-CoV-2/FLU/RSV plus assay is intended as an aid in the diagnosis of influenza from Nasopharyngeal swab specimens and should not be used as a sole basis for treatment. Nasal washings and aspirates are unacceptable for Xpert Xpress SARS-CoV-2/FLU/RSV testing.  Fact Sheet for Patients: BloggerCourse.com  Fact Sheet for Healthcare Providers: SeriousBroker.it  This test is not yet approved or cleared by the [place] FDA and has been authorized for detection and/or diagnosis of SARS-CoV-2 by FDA under an Emergency Use Authorization (EUA). This EUA will remain in effect (meaning this test can be used) for the duration of the COVID-19 declaration under Section 564(b)(1) of the Act, 21 U.S.C. section 360bbb-3(b)(1), unless the authorization is terminated or revoked.  Performed at [facility], [address] [place], [place] [address]  Radiology Studies: DG Chest Portable 1 View  Result Date: 03/07/2021 CLINICAL DATA:  sob EXAM: PORTABLE CHEST 1 VIEW COMPARISON:  Chest x-ray 08/02/2016 FINDINGS: The heart and mediastinal contours are within normal limits. No focal consolidation. No pulmonary edema. No pleural effusion. No pneumothorax. No acute osseous abnormality. IMPRESSION: No active disease. Electronically Signed   By: Tish Frederickson M.D.   On: 03/07/2021 22:54        Scheduled Meds:  enoxaparin (LOVENOX) injection  40 mg Subcutaneous Daily   ipratropium-albuterol  3 mL Nebulization QID   methylPREDNISolone (SOLU-MEDROL) injection  125 mg Intravenous Q12H   Continuous Infusions:   LOS: 0 days        Huey Bienenstock, MD [facility]   To  contact the attending provider between 7A-7P or the covering provider during after hours 7P-7A, please log into the web site [email or web address] and access using universal [place] password for that web site. If you do not have the password, please call the hospital operator.  03/08/2021, 1:20 PM   Patient ID: Luis Hitch., male   DOB: 1965-05-14, 55 y.o.   MRN: [id number]

## 2021-03-08 NOTE — ED Notes (Signed)
Per Maralyn Sago on 6th floor, pt will be accepted but they have to discharge some patients first which is why there has not been a purple man assigned.

## 2021-03-09 LAB — BASIC METABOLIC PANEL
Anion gap: 7 (ref 5–15)
BUN: 13 mg/dL (ref 6–20)
CO2: 23 mmol/L (ref 22–32)
Calcium: 11.5 mg/dL — ABNORMAL HIGH (ref 8.9–10.3)
Chloride: 106 mmol/L (ref 98–111)
Creatinine, Ser: 0.98 mg/dL (ref 0.61–1.24)
GFR, Estimated: >60 mL/min (ref >60)
Glucose, Bld: 159 mg/dL — ABNORMAL HIGH (ref 70–99)
Potassium: 4.8 mmol/L (ref 3.5–5.1)
Sodium: 136 mmol/L (ref 135–145)

## 2021-03-09 LAB — CBC
HCT: 39.7 % (ref 39.0–52.0)
Hemoglobin: 13.5 g/dL (ref 13.0–17.0)
MCH: 29.7 pg (ref 26.0–34.0)
MCHC: 34 g/dL (ref 30.0–36.0)
MCV: 87.3 fL (ref 80.0–100.0)
Platelets: 299 10*3/uL (ref 150–400)
RBC: 4.55 MIL/uL (ref 4.22–5.81)
RDW: 13 % (ref 11.5–15.5)
WBC: 11.4 10*3/uL — ABNORMAL HIGH (ref 4.0–10.5)
nRBC: 0 % (ref 0.0–0.2)

## 2021-03-09 LAB — HIV ANTIBODY (ROUTINE TESTING W REFLEX): HIV Screen 4th Generation wRfx: NONREACTIVE

## 2021-03-09 MED ORDER — METHYLPREDNISOLONE SODIUM SUCC 40 MG IJ SOLR
40.0000 mg | Freq: Two times a day (BID) | INTRAMUSCULAR | Status: DC
  Administered 2021-03-09 – 2021-03-10 (×2): 40 mg via INTRAVENOUS
  Filled 2021-03-09 (×2): qty 1

## 2021-03-09 MED ORDER — IPRATROPIUM-ALBUTEROL 0.5-2.5 (3) MG/3ML IN SOLN
3.0000 mL | Freq: Three times a day (TID) | RESPIRATORY_TRACT | Status: DC
  Administered 2021-03-10: 3 mL via RESPIRATORY_TRACT
  Filled 2021-03-09: qty 3

## 2021-03-09 NOTE — Progress Notes (Signed)
PROGRESS NOTE    Luis Hitcheginald A Sharrar Jr.  MVH:[id number] DOB: Oct 21, 1965 DOA: 03/07/2021 PCP: Pcp, No    Chief Complaint  Patient presents with   Shortness of Breath    Brief Narrative:    Luis Hitcheginald A Melnyk Jr. is a pleasant 56 y.o. male with medical history significant for asthma, now presenting to the emergency department for evaluation of shortness of breath, cough, and wheezing.  Work-up significant for asthma exacerbation, he was admitted for further management.       Assessment & Plan:   Principal Problem:   Asthma exacerbation  Asthma exacerbation  -Patient with known history of asthma exacerbation, who presents with dyspnea, cough, nonproductive. -Sent with significant improvement today, no respiratory distress, no use of accessory muscles, air entry has significantly improved even though remains with some wheezing, I will go ahead and decrease his IV Solu-Medrol to 40 mg IV twice daily, hopefully can be transitioned tomorrow to prednisone taper, continue scheduled DuoNebs and as needed albuterol  - Likely precipitated by viral infection, COVID and influenza are negative  - Continue systemic steroids and albuterol     Tobacco abuse  - patient has been planning to quit and plans to do that now      DVT prophylaxis: Lovenox Code Status: Full Family Communication: Noe at bedside Disposition:   Status is: Observation  The patient will require care spanning > 2 midnights and should be moved to inpatient because: Means with significant wheezing and diminished air entry where he is requiring IV steroids diminished air entry     Consultants:  none   Subjective:  Patient reports he is feeling better today, dyspnea has significantly improved, he still reports some wheezing.  Objective: Vitals:   03/08/21 2149 03/09/21 0027 03/09/21 0532 03/09/21 0731  BP:  (!) 135/91 139/79 131/80  Pulse:  90 89 89  Resp:  18 17 16   Temp:  98 F (36.7 C) (!) 97.5 F (36.4  C) 97.8 F (36.6 C)  TempSrc:  Oral Oral Oral  SpO2: 94% 94% 97% 96%  Weight:      Height:        Intake/Output Summary (Last 24 hours) at 03/09/2021 1429 Last data filed at 03/08/2021 2026 Gross per 24 hour  Intake --  Output 300 ml  Net -300 ml   Filed Weights   03/07/21 2224  Weight: 90.7 kg    Examination:  Awake Alert, Oriented X 3, No new F.N deficits, Normal affect Symmetrical Chest wall movement, improved air entry bilaterally, he remains with some wheezing, but this has significantly improved as well. RRR,No Gallops,Rubs or new Murmurs, No Parasternal Heave +ve B.Sounds, Abd Soft, No tenderness, No rebound - guarding or rigidity. No Cyanosis, Clubbing or edema, No new Rash or bruise    .     Data Reviewed: I have personally reviewed following labs and imaging studies  CBC: Recent Labs  Lab 03/07/21 2234 03/09/21 0130  WBC 7.4 11.4*  HGB 13.9 13.5  HCT 41.6 39.7  MCV 89.1 87.3  PLT 284 299    Basic Metabolic Panel: Recent Labs  Lab 03/07/21 2234 03/09/21 0130  NA 133* 136  K 4.2 4.8  CL 104 106  CO2 22 23  GLUCOSE 109* 159*  BUN 11 13  CREATININE 0.97 0.98  CALCIUM 10.6* 11.5*    GFR: Estimated Creatinine Clearance: 92.6 mL/min (by C-G formula based on SCr of 0.98 mg/dL).  Liver Function Tests: No results for input(s): AST, ALT, ALKPHOS,  BILITOT, PROT, ALBUMIN in the last 168 hours.  CBG: No results for input(s): GLUCAP in the last 168 hours.   Recent Results (from the past 240 hour(s))  Resp Panel by RT-PCR (Flu A&B, Covid) Nasopharyngeal Swab     Status: None   Collection Time: 03/08/21  2:32 AM   Specimen: Nasopharyngeal Swab; Nasopharyngeal(NP) swabs in vial transport medium  Result Value Ref Range Status   SARS Coronavirus 2 by RT PCR NEGATIVE NEGATIVE Final    Comment: (NOTE) SARS-CoV-2 target nucleic acids are NOT DETECTED.  The SARS-CoV-2 RNA is generally detectable in upper respiratory specimens during the acute phase of  infection. The lowest concentration of SARS-CoV-2 viral copies this assay can detect is 138 copies/mL. A negative result does not preclude SARS-Cov-2 infection and should not be used as the sole basis for treatment or other patient management decisions. A negative result may occur with  improper specimen collection/handling, submission of specimen other than nasopharyngeal swab, presence of viral mutation(s) within the areas targeted by this assay, and inadequate number of viral copies(<138 copies/mL). A negative result must be combined with clinical observations, patient history, and epidemiological information. The expected result is Negative.  Fact Sheet for Patients:  BloggerCourse.com  Fact Sheet for Healthcare Providers:  SeriousBroker.it  This test is no t yet approved or cleared by the [place] FDA and  has been authorized for detection and/or diagnosis of SARS-CoV-2 by FDA under an Emergency Use Authorization (EUA). This EUA will remain  in effect (meaning this test can be used) for the duration of the COVID-19 declaration under Section 564(b)(1) of the Act, 21 U.S.C.section 360bbb-3(b)(1), unless the authorization is terminated  or revoked sooner.       Influenza A by PCR NEGATIVE NEGATIVE Final   Influenza B by PCR NEGATIVE NEGATIVE Final    Comment: (NOTE) The Xpert Xpress SARS-CoV-2/FLU/RSV plus assay is intended as an aid in the diagnosis of influenza from Nasopharyngeal swab specimens and should not be used as a sole basis for treatment. Nasal washings and aspirates are unacceptable for Xpert Xpress SARS-CoV-2/FLU/RSV testing.  Fact Sheet for Patients: BloggerCourse.com  Fact Sheet for Healthcare Providers: SeriousBroker.it  This test is not yet approved or cleared by the [place] FDA and has been authorized for detection and/or diagnosis of SARS-CoV-2  by FDA under an Emergency Use Authorization (EUA). This EUA will remain in effect (meaning this test can be used) for the duration of the COVID-19 declaration under Section 564(b)(1) of the Act, 21 U.S.C. section 360bbb-3(b)(1), unless the authorization is terminated or revoked.  Performed at [facility], [address], [place], [place] [address]          Radiology Studies: DG Chest Portable 1 View  Result Date: 03/07/2021 CLINICAL DATA:  sob EXAM: PORTABLE CHEST 1 VIEW COMPARISON:  Chest x-ray 08/02/2016 FINDINGS: The heart and mediastinal contours are within normal limits. No focal consolidation. No pulmonary edema. No pleural effusion. No pneumothorax. No acute osseous abnormality. IMPRESSION: No active disease. Electronically Signed   By: Tish Frederickson M.D.   On: 03/07/2021 22:54        Scheduled Meds:  enoxaparin (LOVENOX) injection  40 mg Subcutaneous Daily   ipratropium-albuterol  3 mL Nebulization Q6H   methylPREDNISolone (SOLU-MEDROL) injection  40 mg Intravenous Q12H   pantoprazole  40 mg Oral Daily   Continuous Infusions:   LOS: 1 day        Huey Bienenstock, MD [facility]   To  contact the attending provider between 7A-7P or the covering provider during after hours 7P-7A, please log into the web site [email or web address] and access using universal [place] password for that web site. If you do not have the password, please call the hospital operator.  03/09/2021, 2:29 PM   Patient ID: Luis Hitch., male   DOB: 01/23/1966, 56 y.o.   MRN: [id number] Patient ID: Luis Goshert., male   DOB: Jul 05, 1965, 56 y.o.   MRN: [id number]

## 2021-03-10 ENCOUNTER — Other Ambulatory Visit (HOSPITAL_COMMUNITY): Payer: Self-pay

## 2021-03-10 MED ORDER — ALBUTEROL SULFATE HFA 108 (90 BASE) MCG/ACT IN AERS
2.0000 | INHALATION_SPRAY | Freq: Four times a day (QID) | RESPIRATORY_TRACT | 1 refills | Status: DC | PRN
  Filled 2021-03-10: qty 8.5, 25d supply, fill #0

## 2021-03-10 MED ORDER — ACETAMINOPHEN 325 MG PO TABS: 650.0000 mg | ORAL_TABLET | Freq: Four times a day (QID) | ORAL | Status: AC | PRN

## 2021-03-10 MED ORDER — FLUTICASONE PROPIONATE HFA 110 MCG/ACT IN AERO
1.0000 | INHALATION_SPRAY | Freq: Two times a day (BID) | RESPIRATORY_TRACT | 0 refills | Status: DC
  Filled 2021-03-10: qty 12, 30d supply, fill #0

## 2021-03-10 MED ORDER — PREDNISONE 10 MG (21) PO TBPK
ORAL_TABLET | ORAL | 0 refills | Status: DC
  Filled 2021-03-10: qty 21, 5d supply, fill #0

## 2021-03-10 NOTE — Plan of Care (Signed)
Problem: Clinical Measurements: Goal: Respiratory complications will improve Outcome: Completed/Met  Discharged to go home today. Self care

## 2021-03-10 NOTE — TOC Initial Note (Addendum)
Transition of Care (TOC) - Initial/Assessment Note    Patient Details  Name: Luis Gay. MRN: [id number] Date of Birth: 1966-01-18  Transition of Care [facility] CM/SW Contact:    Marilu Favre, RN Phone Number: 03/10/2021, 10:56 AM  Clinical Narrative:                  Patient from home with parents. Updated face sheet information.   Patient needs PCP and has no insurance.   Patient entered in [facility] and can afford scripts. TOC will bring medications to patient prior to discharge.   Clinics closed today due to holiday . Provided [facility] information on AVS and patient will call tomorrow to schedule appointment.   Patient voiced understanding to all of above  Expected Discharge Plan: Home/Self Care Barriers to Discharge: No Barriers Identified   Patient Goals and CMS Choice Patient states their goals for this hospitalization and ongoing recovery are:: to return to home CMS Medicare.gov Compare Post Acute Care list provided to:: Patient    Expected Discharge Plan and Services Expected Discharge Plan: Home/Self Care   Discharge Planning Services: [facility], [facility], Medication Assistance   Living arrangements for the past 2 months: Single Family Home Expected Discharge Date: 03/10/21               DME Arranged: N/A         HH Arranged: NA          Prior Living Arrangements/Services Living arrangements for the past 2 months: Single Family Home Lives with:: Parents Patient language and need for interpreter reviewed:: Yes Do you feel safe going back to the place where you live?: Yes      Need for Family Participation in Patient Care: Yes (Comment) Care giver support system in place?: Yes (comment)   Criminal Activity/Legal Involvement Pertinent to Current Situation/Hospitalization: No - Comment as needed  Activities of Daily Living      Permission Sought/Granted   Permission granted to share information  with : No              Emotional Assessment Appearance:: Appears stated age Attitude/Demeanor/Rapport: Engaged Affect (typically observed): Accepting Orientation: : Oriented to Self, Oriented to Place, Oriented to  Time, Oriented to Situation Alcohol / Substance Use: Not Applicable Psych Involvement: No (comment)  Admission diagnosis:  Moderate persistent asthma with exacerbation [J45.41] Asthma exacerbation [J45.901] Patient Active Problem List   Diagnosis Date Noted   Pleuritic chest pain 04/10/2015   Asthma exacerbation 09/02/2013   Cigarette smoker one half pack a day or less 09/02/2013   Tachycardia 09/02/2013   PCP:  Pcp, No Pharmacy:   [id number] - [place], [place] [address] [place] [address] Phone: [phone] Fax: [phone]  [facility] [address] [place] [address] Phone: [phone] Fax: [phone]     Social Determinants of Health (SDOH) Interventions    Readmission Risk Interventions No flowsheet data found.

## 2021-03-10 NOTE — Progress Notes (Signed)
Nsg Discharge Note  Admit Date:  03/07/2021 Discharge date: 03/10/2021   [name]. to be D/C'd Home per MD order.  AVS completed.  Copy for chart, and copy for patient signed, and dated. Patient/caregiver able to verbalize understanding.  Discharge Medication: Allergies as of 03/10/2021   No Known Allergies      Medication List     STOP taking these medications    amoxicillin 500 MG capsule Commonly known as: AMOXIL   azithromycin 500 MG tablet Commonly known as: ZITHROMAX   guaiFENesin 600 MG 12 hr tablet Commonly known as: MUCINEX   HYDROcodone-acetaminophen 5-325 MG tablet Commonly known as: NORCO/VICODIN   ipratropium 17 MCG/ACT inhaler Commonly known as: ATROVENT HFA   predniSONE 20 MG tablet Commonly known as: DELTASONE Replaced by: predniSONE 10 MG (21) Tbpk tablet       TAKE these medications    acetaminophen 325 MG tablet Commonly known as: TYLENOL Take 2 tablets (650 mg total) by mouth every 6 (six) hours as needed for mild pain (or Fever >/= 101).   albuterol 108 (90 Base) MCG/ACT inhaler Commonly known as: VENTOLIN HFA Inhale 2 puffs into the lungs every 6 (six) hours as needed for wheezing.   Flovent HFA 110 MCG/ACT inhaler Generic drug: fluticasone Inhale 1 puff into the lungs 2 (two) times daily.   predniSONE 10 MG (21) Tbpk tablet Commonly known as: STERAPRED UNI-PAK 21 TAB Please use per package instruction. Replaces: predniSONE 20 MG tablet        Discharge Assessment: Vitals:   03/10/21 0817 03/10/21 0839  BP:  124/76  Pulse: 94 90  Resp: 16 18  Temp:  (!) 97.5 F (36.4 C)  SpO2: 95% 97%   Skin clean, dry and intact without evidence of skin break down, no evidence of skin tears noted. IV catheter discontinued intact. Site without signs and symptoms of complications - no redness or edema noted at insertion site, patient denies c/o pain - only slight tenderness at site.  Dressing with slight pressure applied.  D/c  Instructions-Education: Discharge instructions given to patient/family with verbalized understanding. D/c education completed with patient/family including follow up instructions, medication list, d/c activities limitations if indicated, with other d/c instructions as indicated by MD - patient able to verbalize understanding, all questions fully answered. Patient instructed to return to ED, call 911, or call MD for any changes in condition.  Patient walked to the bus stop.  Kyarah Enamorado, Tilford Pillar, RN 03/10/2021 12:53 PM

## 2021-03-10 NOTE — Discharge Instructions (Signed)
Follow with Primary MD  in 7 days   Get CBC, CMP,  checked  by Primary MD next visit.    Activity: As tolerated with Full fall precautions use walker/cane & assistance as needed   Disposition Home    Diet: Regular diet   On your next visit with your primary care physician please Get Medicines reviewed and adjusted.   Please request your Prim.MD to go over all Hospital Tests and Procedure/Radiological results at the follow up, please get all Hospital records sent to your Prim MD by signing hospital release before you go home.   If you experience worsening of your admission symptoms, develop shortness of breath, life threatening emergency, suicidal or homicidal thoughts you must seek medical attention immediately by calling 911 or calling your MD immediately  if symptoms less severe.  You Must read complete instructions/literature along with all the possible adverse reactions/side effects for all the Medicines you take and that have been prescribed to you. Take any new Medicines after you have completely understood and accpet all the possible adverse reactions/side effects.   Do not drive, operating heavy machinery, perform activities at heights, swimming or participation in water activities or provide baby sitting services if your were admitted for syncope or siezures until you have seen by Primary MD or a Neurologist and advised to do so again.  Do not drive when taking Pain medications.    Do not take more than prescribed Pain, Sleep and Anxiety Medications  Special Instructions: If you have smoked or chewed Tobacco  in the last 2 yrs please stop smoking, stop any regular Alcohol  and or any Recreational drug use.  Wear Seat belts while driving.   Please note  You were cared for by a hospitalist during your hospital stay. If you have any questions about your discharge medications or the care you received while you were in the hospital after you are discharged, you can call the  unit and asked to speak with the hospitalist on call if the hospitalist that took care of you is not available. Once you are discharged, your primary care physician will handle any further medical issues. Please note that NO REFILLS for any discharge medications will be authorized once you are discharged, as it is imperative that you return to your primary care physician (or establish a relationship with a primary care physician if you do not have one) for your aftercare needs so that they can reassess your need for medications and monitor your lab values.  

## 2021-03-10 NOTE — Discharge Summary (Addendum)
Physician Discharge Summary  Luis Hitcheginald A Ratledge Jr. UEA:[id number] DOB: June 13, 1965 DOA: 03/07/2021  PCP: Pcp, No  Admit date: 03/07/2021 Discharge date: 03/10/2021  Admitted From: Home Disposition:  Home  Recommendations for Outpatient Follow-up:  Follow up with PCP in 2 weeks Please continue counseling about tobacco cessation  Home Health: NO  Discharge Condition:Stable CODE STATUS:FULL Diet recommendation: Regular  Brief/Interim Summary:    Asthma exacerbation  -Patient with known history of asthma exacerbation, who presents with dyspnea, cough, nonproductive. -Initially with significant wheezing, dyspnea, required significant dose steroids, but he never required any oxygen, wheezing has improved, his IV steroids were tapered, he will be able to be transitioned to prednisone taper today, he will be discharged on Flonase inhaler and as needed albuterol as well . -TOC team to arrange for meds on discharge, and outpatient follow-up.  Tobacco abuse  - patient has been planning to quit and plans to do that now     Discharge Diagnoses:  Principal Problem:   Asthma exacerbation    Discharge Instructions  Discharge Instructions     Discharge instructions   Complete by: As directed    Increase activity slowly   Complete by: As directed       Allergies as of 03/10/2021   No Known Allergies      Medication List     STOP taking these medications    amoxicillin 500 MG capsule Commonly known as: AMOXIL   azithromycin 500 MG tablet Commonly known as: ZITHROMAX   guaiFENesin 600 MG 12 hr tablet Commonly known as: MUCINEX   HYDROcodone-acetaminophen 5-325 MG tablet Commonly known as: NORCO/VICODIN   ipratropium 17 MCG/ACT inhaler Commonly known as: ATROVENT HFA   predniSONE 20 MG tablet Commonly known as: DELTASONE Replaced by: predniSONE 10 MG (21) Tbpk tablet       TAKE these medications    acetaminophen 325 MG tablet Commonly known as: TYLENOL Take  2 tablets (650 mg total) by mouth every 6 (six) hours as needed for mild pain (or Fever >/= 101).   albuterol 108 (90 Base) MCG/ACT inhaler Commonly known as: VENTOLIN HFA Inhale 2 puffs into the lungs every 6 (six) hours as needed for wheezing.   Flovent HFA 110 MCG/ACT inhaler Generic drug: fluticasone Inhale 1 puff into the lungs 2 (two) times daily.   predniSONE 10 MG (21) Tbpk tablet Commonly known as: STERAPRED UNI-PAK 21 TAB Please use per package instruction. Replaces: predniSONE 20 MG tablet        Follow-up Information     [facility] Follow up.   Contact information: [address] [place] [id number] [phone]               No Known Allergies  Consultations: None   Procedures/Studies: DG Chest Portable 1 View  Result Date: 03/07/2021 CLINICAL DATA:  sob EXAM: PORTABLE CHEST 1 VIEW COMPARISON:  Chest x-ray 08/02/2016 FINDINGS: The heart and mediastinal contours are within normal limits. No focal consolidation. No pulmonary edema. No pleural effusion. No pneumothorax. No acute osseous abnormality. IMPRESSION: No active disease. Electronically Signed   By: Tish FredericksonMorgane  Naveau M.D.   On: 03/07/2021 22:54      Subjective:  Patient reports he is feeling better, no nausea, no vomiting, he denies any dyspnea today.  Discharge Exam: Vitals:   03/10/21 0817 03/10/21 0839  BP:  124/76  Pulse: 94 90  Resp: 16 18  Temp:  (!) 97.5 F (36.4 C)  SpO2: 95% 97%  Vitals:   03/09/21 2100 03/10/21 0535 03/10/21 0817 03/10/21 0839  BP: 131/71 126/84  124/76  Pulse: 86 75 94 90  Resp: 18 18 16 18   Temp: 97.8 F (36.6 C) 98.1 F (36.7 C)  (!) 97.5 F (36.4 C)  TempSrc: Oral Oral  Oral  SpO2: 93% 99% 95% 97%  Weight:      Height:        General: Pt is alert, awake, not in acute distress Cardiovascular: RRR, S1/S2 +, no rubs, no gallops Respiratory: CTA bilaterally, scattered wheezing, but good air  entry, no respiratory distress Abdominal: Soft, NT, ND, bowel sounds + Extremities: no edema, no cyanosis    The results of significant diagnostics from this hospitalization (including imaging, microbiology, ancillary and laboratory) are listed below for reference.     Microbiology: Recent Results (from the past 240 hour(s))  Resp Panel by RT-PCR (Flu A&B, Covid) Nasopharyngeal Swab     Status: None   Collection Time: 03/08/21  2:32 AM   Specimen: Nasopharyngeal Swab; Nasopharyngeal(NP) swabs in vial transport medium  Result Value Ref Range Status   SARS Coronavirus 2 by RT PCR NEGATIVE NEGATIVE Final    Comment: (NOTE) SARS-CoV-2 target nucleic acids are NOT DETECTED.  The SARS-CoV-2 RNA is generally detectable in upper respiratory specimens during the acute phase of infection. The lowest concentration of SARS-CoV-2 viral copies this assay can detect is 138 copies/mL. A negative result does not preclude SARS-Cov-2 infection and should not be used as the sole basis for treatment or other patient management decisions. A negative result may occur with  improper specimen collection/handling, submission of specimen other than nasopharyngeal swab, presence of viral mutation(s) within the areas targeted by this assay, and inadequate number of viral copies(<138 copies/mL). A negative result must be combined with clinical observations, patient history, and epidemiological information. The expected result is Negative.  Fact Sheet for Patients:  03/10/21  Fact Sheet for Healthcare Providers:  BloggerCourse.com  This test is no t yet approved or cleared by the [place] FDA and  has been authorized for detection and/or diagnosis of SARS-CoV-2 by FDA under an Emergency Use Authorization (EUA). This EUA will remain  in effect (meaning this test can be used) for the duration of the COVID-19 declaration under Section 564(b)(1) of  the Act, 21 U.S.C.section 360bbb-3(b)(1), unless the authorization is terminated  or revoked sooner.       Influenza A by PCR NEGATIVE NEGATIVE Final   Influenza B by PCR NEGATIVE NEGATIVE Final    Comment: (NOTE) The Xpert Xpress SARS-CoV-2/FLU/RSV plus assay is intended as an aid in the diagnosis of influenza from Nasopharyngeal swab specimens and should not be used as a sole basis for treatment. Nasal washings and aspirates are unacceptable for Xpert Xpress SARS-CoV-2/FLU/RSV testing.  Fact Sheet for Patients: [place]  Fact Sheet for Healthcare Providers: BloggerCourse.com  This test is not yet approved or cleared by the [place] FDA and has been authorized for detection and/or diagnosis of SARS-CoV-2 by FDA under an Emergency Use Authorization (EUA). This EUA will remain in effect (meaning this test can be used) for the duration of the COVID-19 declaration under Section 564(b)(1) of the Act, 21 U.S.C. section 360bbb-3(b)(1), unless the authorization is terminated or revoked.  Performed at [facility], [address], [place], [place]      Labs: BNP (last 3 results) No results for input(s): BNP in the last 8760 hours. Basic Metabolic Panel: Recent Labs  Lab  03/07/21 2234 03/09/21 0130  NA 133* 136  K 4.2 4.8  CL 104 106  CO2 22 23  GLUCOSE 109* 159*  BUN 11 13  CREATININE 0.97 0.98  CALCIUM 10.6* 11.5*   Liver Function Tests: No results for input(s): AST, ALT, ALKPHOS, BILITOT, PROT, ALBUMIN in the last 168 hours. No results for input(s): LIPASE, AMYLASE in the last 168 hours. No results for input(s): AMMONIA in the last 168 hours. CBC: Recent Labs  Lab 03/07/21 2234 03/09/21 0130  WBC 7.4 11.4*  HGB 13.9 13.5  HCT 41.6 39.7  MCV 89.1 87.3  PLT 284 299   Cardiac Enzymes: No results for input(s): CKTOTAL, CKMB, CKMBINDEX, TROPONINI in the last 168 hours. BNP: Invalid  input(s): POCBNP CBG: No results for input(s): GLUCAP in the last 168 hours. D-Dimer No results for input(s): DDIMER in the last 72 hours. Hgb A1c No results for input(s): HGBA1C in the last 72 hours. Lipid Profile No results for input(s): CHOL, HDL, LDLCALC, TRIG, CHOLHDL, LDLDIRECT in the last 72 hours. Thyroid function studies No results for input(s): TSH, T4TOTAL, T3FREE, THYROIDAB in the last 72 hours.  Invalid input(s): FREET3 Anemia work up No results for input(s): VITAMINB12, FOLATE, FERRITIN, TIBC, IRON, RETICCTPCT in the last 72 hours. Urinalysis No results found for: COLORURINE, APPEARANCEUR, LABSPEC, PHURINE, GLUCOSEU, HGBUR, BILIRUBINUR, KETONESUR, PROTEINUR, UROBILINOGEN, NITRITE, LEUKOCYTESUR Sepsis Labs Invalid input(s): PROCALCITONIN,  WBC,  LACTICIDVEN Microbiology Recent Results (from the past 240 hour(s))  Resp Panel by RT-PCR (Flu A&B, Covid) Nasopharyngeal Swab     Status: None   Collection Time: 03/08/21  2:32 AM   Specimen: Nasopharyngeal Swab; Nasopharyngeal(NP) swabs in vial transport medium  Result Value Ref Range Status   SARS Coronavirus 2 by RT PCR NEGATIVE NEGATIVE Final    Comment: (NOTE) SARS-CoV-2 target nucleic acids are NOT DETECTED.  The SARS-CoV-2 RNA is generally detectable in upper respiratory specimens during the acute phase of infection. The lowest concentration of SARS-CoV-2 viral copies this assay can detect is 138 copies/mL. A negative result does not preclude SARS-Cov-2 infection and should not be used as the sole basis for treatment or other patient management decisions. A negative result may occur with  improper specimen collection/handling, submission of specimen other than nasopharyngeal swab, presence of viral mutation(s) within the areas targeted by this assay, and inadequate number of viral copies(<138 copies/mL). A negative result must be combined with clinical observations, patient history, and epidemiological information.  The expected result is Negative.  Fact Sheet for Patients:  BloggerCourse.com  Fact Sheet for Healthcare Providers:  [employer]ious[employer]ker.it  This test is no t yet approved or cleared by the [place] FDA and  has been authorized for detection and/or diagnosis of SARS-CoV-2 by FDA under an Emergency Use Authorization (EUA). This EUA will remain  in effect (meaning this test can be used) for the duration of the COVID-19 declaration under Section 564(b)(1) of the Act, 21 U.S.C.section 360bbb-3(b)(1), unless the authorization is terminated  or revoked sooner.       Influenza A by PCR NEGATIVE NEGATIVE Final   Influenza B by PCR NEGATIVE NEGATIVE Final    Comment: (NOTE) The Xpert Xpress SARS-CoV-2/FLU/RSV plus assay is intended as an aid in the diagnosis of influenza from Nasopharyngeal swab specimens and should not be used as a sole basis for treatment. Nasal washings and aspirates are unacceptable for Xpert Xpress SARS-CoV-2/FLU/RSV testing.  Fact Sheet for Patients: BloggerCourse.com  Fact Sheet for Healthcare Providers: [employer]ious[employer]ker.it  This test is not yet  approved or cleared by the [place] FDA and has been authorized for detection and/or diagnosis of SARS-CoV-2 by FDA under an Emergency Use Authorization (EUA). This EUA will remain in effect (meaning this test can be used) for the duration of the COVID-19 declaration under Section 564(b)(1) of the Act, 21 U.S.C. section 360bbb-3(b)(1), unless the authorization is terminated or revoked.  Performed at [facility], [address] [place], [place] [address]      Time coordinating discharge: 30 minutes  SIGNED:   Huey Bienenstockawood Abria Vannostrand, MD  [facility] 03/10/2021, 12:20 PM Pager   If 7PM-7AM, please contact night-coverage [email or web address] Password [email or web address]

## 2022-07-15 ENCOUNTER — Observation Stay (HOSPITAL_COMMUNITY)
Admission: EM | Admit: 2022-07-15 | Discharge: 2022-07-17 | Disposition: A | Discharge status: Home / Self Care | Payer: Self-pay | Attending: Internal Medicine | Admitting: Internal Medicine

## 2022-07-15 ENCOUNTER — Emergency Department (HOSPITAL_COMMUNITY): Payer: Self-pay

## 2022-07-15 ENCOUNTER — Other Ambulatory Visit: Payer: Self-pay

## 2022-07-15 ENCOUNTER — Encounter (HOSPITAL_COMMUNITY): Payer: Self-pay | Admitting: Emergency Medicine

## 2022-07-15 DIAGNOSIS — Z1152 Encounter for screening for COVID-19: Secondary | ICD-10-CM | POA: Insufficient documentation

## 2022-07-15 DIAGNOSIS — F17210 Nicotine dependence, cigarettes, uncomplicated: Secondary | ICD-10-CM | POA: Insufficient documentation

## 2022-07-15 DIAGNOSIS — F1010 Alcohol abuse, uncomplicated: Secondary | ICD-10-CM | POA: Diagnosis present

## 2022-07-15 DIAGNOSIS — J4541 Moderate persistent asthma with (acute) exacerbation: Principal | ICD-10-CM | POA: Insufficient documentation

## 2022-07-15 DIAGNOSIS — Z79899 Other long term (current) drug therapy: Secondary | ICD-10-CM | POA: Insufficient documentation

## 2022-07-15 DIAGNOSIS — F1910 Other psychoactive substance abuse, uncomplicated: Secondary | ICD-10-CM | POA: Diagnosis present

## 2022-07-15 DIAGNOSIS — Z720 Tobacco use: Secondary | ICD-10-CM | POA: Diagnosis present

## 2022-07-15 DIAGNOSIS — J45901 Unspecified asthma with (acute) exacerbation: Secondary | ICD-10-CM | POA: Diagnosis present

## 2022-07-15 LAB — RESP PANEL BY RT-PCR (RSV, FLU A&B, COVID)  RVPGX2
Influenza A by PCR: NEGATIVE
Influenza B by PCR: NEGATIVE
Resp Syncytial Virus by PCR: NEGATIVE
SARS Coronavirus 2 by RT PCR: NEGATIVE

## 2022-07-15 LAB — BASIC METABOLIC PANEL
Anion gap: 9 (ref 5–15)
BUN: 15 mg/dL (ref 6–20)
CO2: 24 mmol/L (ref 22–32)
Calcium: 11.1 mg/dL — ABNORMAL HIGH (ref 8.9–10.3)
Chloride: 104 mmol/L (ref 98–111)
Creatinine, Ser: 0.93 mg/dL (ref 0.61–1.24)
GFR, Estimated: >60 mL/min (ref >60)
Glucose, Bld: 88 mg/dL (ref 70–99)
Potassium: 4.4 mmol/L (ref 3.5–5.1)
Sodium: 137 mmol/L (ref 135–145)

## 2022-07-15 LAB — CBC
HCT: 43.2 % (ref 39.0–52.0)
Hemoglobin: 14.4 g/dL (ref 13.0–17.0)
MCH: 29.6 pg (ref 26.0–34.0)
MCHC: 33.3 g/dL (ref 30.0–36.0)
MCV: 88.9 fL (ref 80.0–100.0)
Platelets: 329 10*3/uL (ref 150–400)
RBC: 4.86 MIL/uL (ref 4.22–5.81)
RDW: 13.1 % (ref 11.5–15.5)
WBC: 5.2 10*3/uL (ref 4.0–10.5)
nRBC: 0 % (ref 0.0–0.2)

## 2022-07-15 MED ORDER — PREDNISONE 20 MG PO TABS
50.0000 mg | ORAL_TABLET | Freq: Every day | ORAL | Status: DC
  Administered 2022-07-16 – 2022-07-17 (×2): 50 mg via ORAL
  Filled 2022-07-15 (×2): qty 1

## 2022-07-15 MED ORDER — SODIUM CHLORIDE 0.9% FLUSH
3.0000 mL | Freq: Two times a day (BID) | INTRAVENOUS | Status: DC
  Administered 2022-07-15 – 2022-07-17 (×5): 3 mL via INTRAVENOUS

## 2022-07-15 MED ORDER — BUDESONIDE 0.25 MG/2ML IN SUSP
0.2500 mg | Freq: Two times a day (BID) | RESPIRATORY_TRACT | Status: DC
  Administered 2022-07-15 – 2022-07-16 (×2): 0.25 mg via RESPIRATORY_TRACT
  Filled 2022-07-15 (×2): qty 2

## 2022-07-15 MED ORDER — PREDNISONE 20 MG PO TABS
60.0000 mg | ORAL_TABLET | Freq: Once | ORAL | Status: AC
  Administered 2022-07-15: 60 mg via ORAL
  Filled 2022-07-15: qty 3

## 2022-07-15 MED ORDER — MAGNESIUM SULFATE 2 GM/50ML IV SOLN
2.0000 g | Freq: Once | INTRAVENOUS | Status: AC
  Administered 2022-07-15: 2 g via INTRAVENOUS
  Filled 2022-07-15: qty 50

## 2022-07-15 MED ORDER — ALBUTEROL SULFATE (2.5 MG/3ML) 0.083% IN NEBU
10.0000 mg | INHALATION_SOLUTION | Freq: Once | RESPIRATORY_TRACT | Status: AC
  Administered 2022-07-15: 10 mg via RESPIRATORY_TRACT
  Filled 2022-07-15: qty 12

## 2022-07-15 MED ORDER — ENOXAPARIN SODIUM 40 MG/0.4ML IJ SOSY
40.0000 mg | PREFILLED_SYRINGE | INTRAMUSCULAR | Status: DC
  Administered 2022-07-15 – 2022-07-16 (×2): 40 mg via SUBCUTANEOUS
  Filled 2022-07-15 (×2): qty 0.4

## 2022-07-15 MED ORDER — IPRATROPIUM-ALBUTEROL 0.5-2.5 (3) MG/3ML IN SOLN
3.0000 mL | Freq: Once | RESPIRATORY_TRACT | Status: AC
  Administered 2022-07-15: 3 mL via RESPIRATORY_TRACT
  Filled 2022-07-15: qty 3

## 2022-07-15 NOTE — ED Triage Notes (Signed)
Pt in from home via [facility] with worsening asthma symptoms since yesterday. Pt states he is out of inhaler at home, EMS arrived to find him wheezing in all fields. Given 5mg  Albuterol en route, states he was having some chest pain earlier, but denies current cp

## 2022-07-15 NOTE — ED Notes (Signed)
ED TO INPATIENT HANDOFF REPORT  ED Nurse Name and Phone #: Eileen Stanford Adlynn Lowenstein  S Name/Age/Gender Luis Gay. 57 y.o. male Room/Bed: 044C/044C  Code Status   Code Status: Full Code  Home/SNF/Other Home Patient oriented to: self, place, time, and situation Is this baseline? Yes   Triage Complete: Triage complete  Chief Complaint Asthma exacerbation [J45.901]  Triage Note Pt in from home via [facility] with worsening asthma symptoms since yesterday. Pt states he is out of inhaler at home, EMS arrived to find him wheezing in all fields. Given 5mg  Albuterol en route, states he was having some chest pain earlier, but denies current cp   Allergies No Known Allergies  Level of Care/Admitting Diagnosis ED Disposition     ED Disposition  Admit   Condition  --   Comment  Hospital Area: [facility] [[id number]]  Level of Care: Telemetry Medical [104]  May place patient in observation at [facility] or [facility] if equivalent level of care is available:: No  Covid Evaluation: Confirmed COVID Negative  Diagnosis: Asthma exacerbation [161096]  Admitting Physician: Synetta Fail [[id number]]  Attending Physician: Synetta Fail [[id number]]          B Medical/Surgery History Past Medical History:  Diagnosis Date   Asthma    Tobacco use    History reviewed. No pertinent surgical history.   A IV Location/Drains/Wounds Patient Lines/Drains/Airways Status     Active Line/Drains/Airways     Name Placement date Placement time Site Days   Peripheral IV 07/15/22 20 G Left Antecubital 07/15/22  0653  Antecubital  less than 1            Intake/Output Last 24 hours No intake or output data in the 24 hours ending 07/15/22 1642  Labs/Imaging Results for orders placed or performed during the hospital encounter of 07/15/22 (from the past 48 hour(s))  CBC     Status: None   Collection Time: 07/15/22  6:54 AM  Result Value Ref Range   WBC 5.2 4.0 -  10.5 K/uL   RBC 4.86 4.22 - 5.81 MIL/uL   Hemoglobin 14.4 13.0 - 17.0 g/dL   HCT 29.5 62.1 - 30.8 %   MCV 88.9 80.0 - 100.0 fL   MCH 29.6 26.0 - 34.0 pg   MCHC 33.3 30.0 - 36.0 g/dL   RDW 65.7 84.6 - 96.2 %   Platelets 329 150 - 400 K/uL   nRBC 0.0 0.0 - 0.2 %    Comment: Performed at [facility], [address] [place], [place] [address]  Basic metabolic panel     Status: Abnormal   Collection Time: 07/15/22  6:54 AM  Result Value Ref Range   Sodium 137 135 - 145 mmol/L   Potassium 4.4 3.5 - 5.1 mmol/L   Chloride 104 98 - 111 mmol/L   CO2 24 22 - 32 mmol/L   Glucose, Bld 88 70 - 99 mg/dL    Comment: Glucose reference range applies only to samples taken after fasting for at least 8 hours.   BUN 15 6 - 20 mg/dL   Creatinine, Ser 1.32 0.61 - 1.24 mg/dL   Calcium 44.0 (H) 8.9 - 10.3 mg/dL   GFR, Estimated >10 >27 mL/min    Comment: (NOTE) Calculated using the CKD-EPI Creatinine Equation (2021)    Anion gap 9 5 - 15    Comment: Performed at [facility], [address] [place], [place] [address]  Resp  panel by RT-PCR (RSV, Flu A&B, Covid) Anterior Nasal Swab     Status: None   Collection Time: 07/15/22  9:45 AM   Specimen: Anterior Nasal Swab  Result Value Ref Range   SARS Coronavirus 2 by RT PCR NEGATIVE NEGATIVE   Influenza A by PCR NEGATIVE NEGATIVE   Influenza B by PCR NEGATIVE NEGATIVE    Comment: (NOTE) The Xpert Xpress SARS-CoV-2/FLU/RSV plus assay is intended as an aid in the diagnosis of influenza from Nasopharyngeal swab specimens and should not be used as a sole basis for treatment. Nasal washings and aspirates are unacceptable for Xpert Xpress SARS-CoV-2/FLU/RSV testing.  Fact Sheet for Patients: BloggerCourse.com  Fact Sheet for Healthcare Providers: [employer]iousBroker.it  This test is not yet approved or cleared by the [place] FDA and has been authorized for detection and/or diagnosis of  SARS-CoV-2 by FDA under an Emergency Use Authorization (EUA). This EUA will remain in effect (meaning this test can be used) for the duration of the COVID-19 declaration under Section 564(b)(1) of the Act, 21 U.S.C. section 360bbb-3(b)(1), unless the authorization is terminated or revoked.     Resp Syncytial Virus by PCR NEGATIVE NEGATIVE    Comment: (NOTE) Fact Sheet for Patients: BloggerCourse.com  Fact Sheet for Healthcare Providers: [employer]ker.[place]  This test is not yet approved or cleared by the [place] FDA and has been authorized for detection and/or diagnosis of SARS-CoV-2 by FDA under an Emergency Use Authorization (EUA). This EUA will remain in effect (meaning this test can be used) for the duration of the COVID-19 declaration under Section 564(b)(1) of the Act, 21 U.S.C. section 360bbb-3(b)(1), unless the authorization is terminated or revoked.  Performed at [facility], [address], [place], [place] [address]    DG Chest Port 1 View  Result Date: 07/15/2022 CLINICAL DATA:  Shortness of breath EXAM: PORTABLE CHEST 1 VIEW COMPARISON:  X-ray 03/07/2021 and older FINDINGS: Hyperinflation. No consolidation, pneumothorax or effusion. No edema. Normal cardiopericardial silhouette. Overlapping cardiac leads. IMPRESSION: No acute cardiopulmonary disease.  Hyperinflation Electronically Signed   By: Karen Kays M.D.   On: 07/15/2022 10:02    Pending Labs Unresulted Labs (From admission, onward)     Start     Ordered   07/22/22 0500  Creatinine, serum  (enoxaparin (LOVENOX)    CrCl >/= 30 ml/min)  Weekly,   R     Comments: while on enoxaparin therapy    07/15/22 1436   07/15/22 1431  HIV Antibody (routine testing w rflx)  (HIV Antibody (Routine testing w reflex) panel)  Once,   R        07/15/22 1436            Vitals/Pain Today's Vitals   07/15/22 1029 07/15/22 1030 07/15/22 1300 07/15/22 1540  BP:  (!) 121/91 (!) 128/92 134/82   Pulse: 65 67 73   Resp: 18 15 18    Temp:    97.8 F (36.6 C)  TempSrc:    Oral  SpO2: 97% 97% 98%   Weight:      PainSc:        Isolation Precautions No active isolations  Medications Medications  magnesium sulfate IVPB 2 g 50 mL (2 g Intravenous New Bag/Given 07/15/22 1625)  fluticasone (FLOVENT HFA) 110 MCG/ACT inhaler 1 puff (has no administration in time range)  enoxaparin (LOVENOX) injection 40 mg (has no administration in time range)  sodium chloride flush (NS) 0.9 % injection 3 mL (3 mLs Intravenous Given 07/15/22  1625)  predniSONE (DELTASONE) tablet 50 mg (has no administration in time range)  ipratropium-albuterol (DUONEB) 0.5-2.5 (3) MG/3ML nebulizer solution 3 mL (3 mLs Nebulization Given 07/15/22 0822)  predniSONE (DELTASONE) tablet 60 mg (60 mg Oral Given 07/15/22 0822)  albuterol (PROVENTIL) (2.5 MG/3ML) 0.083% nebulizer solution 10 mg (10 mg Nebulization Given 07/15/22 1029)    Mobility walks     Focused Assessments Pulmonary Assessment Handoff:  Lung sounds: Bilateral Breath Sounds: Expiratory wheezes L Breath Sounds: Expiratory wheezes R Breath Sounds: Expiratory wheezes O2 Device: Room Air      R Recommendations: See Admitting Provider Note  Report given to:   Additional Notes:

## 2022-07-15 NOTE — Plan of Care (Signed)
  Problem: Education: Goal: Knowledge of General Education information will improve Description: Including pain rating scale, medication(s)/side effects and non-pharmacologic comfort measures Outcome: Progressing   Problem: Health Behavior/Discharge Planning: Goal: Ability to manage health-related needs will improve Outcome: Progressing   Problem: Clinical Measurements: Goal: Ability to maintain clinical measurements within normal limits will improve Outcome: Progressing Goal: Will remain free from infection Outcome: Progressing Goal: Respiratory complications will improve Outcome: Progressing   Problem: Activity: Goal: Risk for activity intolerance will decrease Outcome: Progressing   Problem: Nutrition: Goal: Adequate nutrition will be maintained Outcome: Progressing   

## 2022-07-15 NOTE — H&P (Signed)
History and Physical   Luis Gay. NGE:[id number] DOB: 13-May-1965 DOA: 07/15/2022  PCP: Pcp, No   Patient coming from: Home  Chief Complaint: Shortness of breath  HPI: Luis Gay. is a 57 y.o. male with medical history significant of asthma, tobacco use, tachycardia presenting with worsening shortness of breath.  Patient reports several days of worsening shortness of breath.  States that he has known history of asthma with prior exacerbation.  He currently does not have a maintenance inhaler and is taking/using his rescue inhaler almost every hour to control his symptoms.  He ran out about a week ago and has had worsening shortness of breath with worsening wheezing since that time.  Significantly worse in the last several days.  Of note, he does work [employer].  He denies fevers, chills, chest pain, abdominal pain, constipation, diarrhea, nausea, vomiting.  ED Course: Vital signs in the ED notable for blood pressure in the 110s to 140 systolic.  Lab workup included BMP with calcium stable 11.1.  CBC within normal notes.  Respiratory panel negative for flu COVID and RSV.  CT chest x-ray showed no acute normality but does show hyperinflation.  Patient received prednisone, magnesium, DuoNebs, albuterol in the ED.  Did receive a DuoNeb and route by EMS as well.  Did have improvement in symptoms but had continued wheezing and worsening of symptoms on ambulation though no desaturation.  Patient stated he felt uncomfortable going home.  Review of Systems: As per HPI otherwise all other systems reviewed and are negative.  Past Medical History:  Diagnosis Date   Asthma    Tobacco use     History reviewed. No pertinent surgical history.  Social History  reports that he has been smoking cigarettes. He has a 7.50 pack-year smoking history. He does not have any smokeless tobacco history on file. He reports current alcohol use of about 12.0 standard drinks of alcohol  per week. He reports current drug use. Drug: Marijuana.  No Known Allergies  Family History  Problem Relation Age of Onset   Asthma Father    Diabetes Other    High blood pressure Other   Reviewed on admission  Prior to Admission medications   Medication Sig Start Date End Date Taking? Authorizing Provider  acetaminophen (TYLENOL) 325 MG tablet Take 2 tablets (650 mg total) by mouth every 6 (six) hours as needed for mild pain (or Fever >/= 101). 03/10/21   Elgergawy, Leana Roe, MD  albuterol (VENTOLIN HFA) 108 (90 Base) MCG/ACT inhaler Inhale 2 puffs into the lungs every 6 (six) hours as needed for wheezing. 03/10/21   Elgergawy, Leana Roe, MD  fluticasone (FLOVENT HFA) 110 MCG/ACT inhaler Inhale 1 puff into the lungs 2 (two) times daily. 03/10/21   Elgergawy, Leana Roe, MD  predniSONE (STERAPRED UNI-PAK 21 TAB) 10 MG (21) TBPK tablet Please use per package instruction. 03/10/21   Elgergawy, Leana Roe, MD    Physical Exam: Vitals:   07/15/22 0945 07/15/22 1029 07/15/22 1030 07/15/22 1300  BP: 135/89 (!) 121/91 (!) 128/92 134/82  Pulse:  65 67 73  Resp: 17 18 15 18   Temp:      TempSrc:      SpO2: 98% 97% 97% 98%  Weight:        Physical Exam Constitutional:      General: He is not in acute distress.    Appearance: Normal appearance.  HENT:     Head: Normocephalic and atraumatic.     Mouth/Throat:  Mouth: Mucous membranes are moist.     Pharynx: Oropharynx is clear.  Eyes:     Extraocular Movements: Extraocular movements intact.     Pupils: Pupils are equal, round, and reactive to light.  Cardiovascular:     Rate and Rhythm: Normal rate and regular rhythm.     Pulses: Normal pulses.     Heart sounds: Normal heart sounds.  Pulmonary:     Effort: Pulmonary effort is normal. No respiratory distress.     Breath sounds: Wheezing present.  Abdominal:     General: Bowel sounds are normal. There is no distension.     Palpations: Abdomen is soft.     Tenderness: There is no abdominal  tenderness.  Musculoskeletal:        General: No swelling or deformity.  Skin:    General: Skin is warm and dry.  Neurological:     General: No focal deficit present.     Mental Status: Mental status is at baseline.    Labs on Admission: I have personally reviewed following labs and imaging studies  CBC: Recent Labs  Lab 07/15/22 0654  WBC 5.2  HGB 14.4  HCT 43.2  MCV 88.9  PLT 329    Basic Metabolic Panel: Recent Labs  Lab 07/15/22 0654  NA 137  K 4.4  CL 104  CO2 24  GLUCOSE 88  BUN 15  CREATININE 0.93  CALCIUM 11.1*    GFR: CrCl cannot be calculated (Unknown ideal weight.).  Liver Function Tests: No results for input(s): "AST", "ALT", "ALKPHOS", "BILITOT", "PROT", "ALBUMIN" in the last 168 hours.  Urine analysis: No results found for: "COLORURINE", "APPEARANCEUR", "LABSPEC", "PHURINE", "GLUCOSEU", "HGBUR", "BILIRUBINUR", "KETONESUR", "PROTEINUR", "UROBILINOGEN", "NITRITE", "LEUKOCYTESUR"  Radiological Exams on Admission: DG Chest Port 1 View  Result Date: 07/15/2022 CLINICAL DATA:  Shortness of breath EXAM: PORTABLE CHEST 1 VIEW COMPARISON:  X-ray 03/07/2021 and older FINDINGS: Hyperinflation. No consolidation, pneumothorax or effusion. No edema. Normal cardiopericardial silhouette. Overlapping cardiac leads. IMPRESSION: No acute cardiopulmonary disease.  Hyperinflation Electronically Signed   By: Karen Kays M.D.   On: 07/15/2022 10:02    EKG: Independently reviewed.  Sinus rhythm at 78 bpm.  Nonspecific T wave flattening.  J-point elevation in anterior leads.  Assessment/Plan Principal Problem:   Asthma exacerbation   Asthma exacerbation > Patient with worsening shortness of breath after running out of> Carylon Perches entirely a week ago. > Currently no PCP.  Has not had maintenance inhaler for some time.  Has been using rescue inhaler to maintain symptoms until he ran out a week ago. > Chest x-ray without acute abnormality, flu COVID and RSV negative.  No  leukocytosis. > Some improvement with breathing treatments, magnesium, prednisone in the ED but still wheezing and worsening symptoms on ambulation with patient on comfortable going home. - Monitor on telemetry overnight - Continue with scheduled DuoNebs - As needed albuterol - Daily prednisone - Will need to be sent home with new maintenance inhaler - Consult to [facility] for PCP  DVT prophylaxis: Lovenox Code Status:   Full Family Communication:  None on admission  Disposition Plan:   Patient is from:  Home  Anticipated DC to:  Home  Anticipated DC date:  1 to 2 days  Anticipated DC barriers: None  Consults called:  None Admission status:  Observation, telemetry  Severity of Illness: The appropriate patient status for this patient is OBSERVATION. Observation status is judged to be reasonable and necessary in order to provide the required intensity of service  to ensure the patient's safety. The patient's presenting symptoms, physical exam findings, and initial radiographic and laboratory data in the context of their medical condition is felt to place them at decreased risk for further clinical deterioration. Furthermore, it is anticipated that the patient will be medically stable for discharge from the hospital within 2 midnights of admission.    Synetta Fail MD [facility]  How to contact the [facility] Attending or Consulting provider 7A - 7P or covering provider during after hours 7P -7A, for this patient?   Check the care team in [facility] and look for a) attending/consulting [facility] provider listed and b) the [facility] team listed Log into [email or web address] and use [facility]'s universal password to access. If you do not have the password, please contact the hospital operator. Locate the [facility] provider you are looking for under [facility] and page to a number that you can be directly reached. If you still have difficulty reaching the provider, please page the [facility] (Director on Call) for the  Hospitalists listed on amion for assistance.  07/15/2022, 2:38 PM

## 2022-07-15 NOTE — ED Notes (Signed)
Walked patient around the room patient oxygen dropped  down to 94 room air assist patient back to bed patient oxygen level went back up to 98 room air patient stated that he was SOB patient is back in bed on the monitor with call bell in reach

## 2022-07-15 NOTE — Progress Notes (Signed)
Patient has arrived to the floor. 

## 2022-07-15 NOTE — ED Provider Notes (Signed)
[place] EMERGENCY DEPARTMENT AT [facility] Provider Note   CSN: [id number] Arrival date & time: 07/15/22  9528     History  Chief Complaint  Patient presents with   Shortness of Breath    Luis Gay. is a 57 y.o. male.  The history is provided by the patient and medical records. No language interpreter was used.  Shortness of Breath    57 year old male significant history of asthma currently on fluticasone, albuterol, presenting to ED via EMS from home with complaints of shortness of breath. Patient mention he normally use his inhaler is an hourly basis to help control his asthma.  He works in [employer].  He ran out of his inhaler approximately a week ago and since then he endorsed progressive worsening shortness of breath x 2 days.  Initially he did have some chest discomfort transiently but states it did not last long and his primary complaint is just shortness of breath.  He endorsed increased wheezing.  Did denies any fever, chills, productive cough, nausea vomiting diarrhea.  Denies any other environmental changes or medication changes.  He does have a tobacco history.  He does not have a PCP.  He did receive breathing treatment via EMS and reported that is helping.  He has been hospitalized in the past for asthma exacerbation.  Home Medications Prior to Admission medications   Medication Sig Start Date End Date Taking? Authorizing Provider  acetaminophen (TYLENOL) 325 MG tablet Take 2 tablets (650 mg total) by mouth every 6 (six) hours as needed for mild pain (or Fever >/= 101). 03/10/21   Elgergawy, Leana Roe, MD  albuterol (VENTOLIN HFA) 108 (90 Base) MCG/ACT inhaler Inhale 2 puffs into the lungs every 6 (six) hours as needed for wheezing. 03/10/21   Elgergawy, Leana Roe, MD  fluticasone (FLOVENT HFA) 110 MCG/ACT inhaler Inhale 1 puff into the lungs 2 (two) times daily. 03/10/21   Elgergawy, Leana Roe, MD  predniSONE (STERAPRED UNI-PAK 21 TAB) 10 MG (21) TBPK  tablet Please use per package instruction. 03/10/21   Elgergawy, Leana Roe, MD      Allergies    Patient has no known allergies.    Review of Systems   Review of Systems  Respiratory:  Positive for shortness of breath.   All other systems reviewed and are negative.   Physical Exam Updated Vital Signs BP 127/87   Pulse 81   Temp 97.7 F (36.5 C) (Oral)   Resp 20   Wt 90.7 kg   SpO2 99%   BMI 29.53 kg/m  Physical Exam Vitals and nursing note reviewed.  Constitutional:      General: He is not in acute distress.    Appearance: He is well-developed.     Comments: Patient is sitting in bed resting comfortably appears to be in no acute discomfort.  He is watching TV, able to speak in complete sentences.  HENT:     Head: Atraumatic.  Eyes:     Conjunctiva/sclera: Conjunctivae normal.  Cardiovascular:     Rate and Rhythm: Normal rate and regular rhythm.  Pulmonary:     Effort: Pulmonary effort is normal. No tachypnea.     Breath sounds: Wheezing (mild expiratory wheezes) present. No decreased breath sounds, rhonchi or rales.  Chest:     Chest wall: No tenderness.  Musculoskeletal:     Cervical back: Neck supple.     Right lower leg: No edema.     Left lower leg: No edema.  Skin:    Findings: No rash.  Neurological:     Mental Status: He is alert and oriented to person, place, and time.  Psychiatric:        Mood and Affect: Mood normal.     ED Results / Procedures / Treatments   Labs (all labs ordered are listed, but only abnormal results are displayed) Labs Reviewed  BASIC METABOLIC PANEL - Abnormal; Notable for the following components:      Result Value   Calcium 11.1 (*)    All other components within normal limits  RESP PANEL BY RT-PCR (RSV, FLU A&B, COVID)  RVPGX2  CBC  HIV ANTIBODY (ROUTINE TESTING W REFLEX)    EKG EKG Interpretation  Date/Time:  Wednesday Jul 15 2022 06:56:05 EDT Ventricular Rate:  78 PR Interval:  166 QRS Duration: 100 QT  Interval:  394 QTC Calculation: 449 R Axis:   49 Text Interpretation: Sinus rhythm Anteroseptal infarct, old Confirmed by Zadie Rhine ([id number]) on 07/15/2022 7:01:09 AM  Radiology DG Chest Port 1 View  Result Date: 07/15/2022 CLINICAL DATA:  Shortness of breath EXAM: PORTABLE CHEST 1 VIEW COMPARISON:  X-ray 03/07/2021 and older FINDINGS: Hyperinflation. No consolidation, pneumothorax or effusion. No edema. Normal cardiopericardial silhouette. Overlapping cardiac leads. IMPRESSION: No acute cardiopulmonary disease.  Hyperinflation Electronically Signed   By: Karen Kays M.D.   On: 07/15/2022 10:02    Procedures .Critical Care  Performed by: Fayrene Helper, PA-C Authorized by: Fayrene Helper, PA-C   Critical care provider statement:    Critical care time (minutes):  30   Critical care was time spent personally by me on the following activities:  Development of treatment plan with patient or surrogate, discussions with consultants, evaluation of patient's response to treatment, examination of patient, ordering and review of laboratory studies, ordering and review of radiographic studies, ordering and performing treatments and interventions, pulse oximetry, re-evaluation of patient's condition and review of old charts     Medications Ordered in ED Medications  ipratropium-albuterol (DUONEB) 0.5-2.5 (3) MG/3ML nebulizer solution 3 mL (3 mLs Nebulization Given 07/15/22 0822)  predniSONE (DELTASONE) tablet 60 mg (60 mg Oral Given 07/15/22 0822)  albuterol (PROVENTIL) (2.5 MG/3ML) 0.083% nebulizer solution 10 mg (10 mg Nebulization Given 07/15/22 1029)    ED Course/ Medical Decision Making/ A&P                             Medical Decision Making Amount and/or Complexity of Data Reviewed Labs: ordered. Radiology: ordered.  Risk Prescription drug management. Decision regarding hospitalization.   BP 127/87   Pulse 81   Temp 97.7 F (36.5 C) (Oral)   Resp 20   Wt 90.7 kg   SpO2 99%   BMI  29.53 kg/m   33:42 AM 57 year old male significant history of asthma currently on fluticasone, albuterol, presenting to ED via EMS from home with complaints of shortness of breath. Patient mention he normally use his inhaler is an hourly basis to help control his asthma.  He works in [employer].  He ran out of his inhaler approximately a week ago and since then he endorsed progressive worsening shortness of breath.  Initially he did have some chest discomfort transiently but states it did not last long and his primary complaint is just shortness of breath.  He endorsed increased wheezing.  Did denies any fever, chills, productive cough, nausea vomiting diarrhea.  Denies any other environmental changes or medication changes.  He does  have a tobacco history.  He does not have a PCP.  He did receive breathing treatment via EMS and reported that is helping.  He has been hospitalized in the past for asthma exacerbation.  On exam this is a well-appearing male resting comfortably in bed appears to be in no acute discomfort.  He is watching TV, he is able to speak in complete sentences.  Heart with normal rate and rhythm, lungs with mild expiratory wheezes but lung sounds heard.  No rales or rhonchi heard.  He does not have any peripheral edema.  No JVD.  Vital signs reviewed overall reassuring no fever no hypoxia.  EMR reviewed, patient has been eating hospitalized for moderate persistent asthma exacerbation in the past.  Based on exam, vital sign, I felt patient is having a mild asthma exacerbation in the setting of not having his inhaler.  I felt patient will benefit from symptomatic treatment in the ED and likely stable to go home with a rescue inhaler, prednisone.  I have considered obtaining chest x-ray along with basic labs however I have low suspicion for pneumonia, PE, CHF, or other acute pulmonary etiology and therefore we will forego additional workup at this time.  9:46 AM After receiving 2  breathing treatment, we have patient ambulating.  He became increased short of breath and O2 sats did drop to 94% on room air.  On reassessment, he still has some respiratory wheezes although improved from prior.  Will initiate continuous nebulizer treatment, will check labs and will monitor closely.  2:21 PM -Labs ordered, independently viewed and interpreted by me.  Labs remarkable for normal WBC, normal H&H -The patient was maintained on a cardiac monitor.  I personally viewed and interpreted the cardiac monitored which showed an underlying rhythm of: NSR -Imaging independently viewed and interpreted by me and I agree with radiologist's interpretation.  Result remarkable for CXR showing no acute changes -This patient presents to the ED for concern of sob, this involves an extensive number of treatment options, and is a complaint that carries with it a high risk of complications and morbidity.  The differential diagnosis includes asthma exacerbation, copd exacerbation, PE, PNA, reactive airway disease, anemia -Co morbidities that complicate the patient evaluation includes asthma -Treatment includes multiple rounds of nebulizer, prednisone -Reevaluation of the patient after these medicines showed that the patient improved -PCP office notes or outside notes reviewed -Discussion with specialist [facility] Dr. Alinda Money who agrees to see and will admit pt -Escalation to admission/observation considered: patient request for admission.  2:38 PM Patient has received a total of 3 separate breathing treatment.  He received DuoNeb from EMS, received a DuoNeb here as well as continuous nebs and magnesium.  Although on reexamination his lung sounds more clear and wheezing did improve when ambulating patient still endorsed having shortness of breath and does not feel comfortable going home.  He did not become hypoxic during his ambulation.  I will consult medicine for admission.        Final Clinical  Impression(s) / ED Diagnoses Final diagnoses:  Moderate persistent asthma with exacerbation    Rx / DC Orders ED Discharge Orders     None         Fayrene Helper, PA-C 07/15/22 1439    Eber Hong, MD 07/15/22 2012

## 2022-07-16 DIAGNOSIS — F1010 Alcohol abuse, uncomplicated: Secondary | ICD-10-CM | POA: Diagnosis present

## 2022-07-16 DIAGNOSIS — F1910 Other psychoactive substance abuse, uncomplicated: Secondary | ICD-10-CM | POA: Diagnosis present

## 2022-07-16 DIAGNOSIS — J4541 Moderate persistent asthma with (acute) exacerbation: Secondary | ICD-10-CM

## 2022-07-16 DIAGNOSIS — Z720 Tobacco use: Secondary | ICD-10-CM | POA: Diagnosis present

## 2022-07-16 LAB — CBC WITH DIFFERENTIAL/PLATELET
Abs Immature Granulocytes: 0.01 10*3/uL (ref 0.00–0.07)
Abs Immature Granulocytes: 0.04 10*3/uL (ref 0.00–0.07)
Basophils Absolute: 0 10*3/uL (ref 0.0–0.1)
Basophils Absolute: 0 10*3/uL (ref 0.0–0.1)
Basophils Relative: 0 %
Basophils Relative: 0 %
Eosinophils Absolute: 0.3 10*3/uL (ref 0.0–0.5)
Eosinophils Absolute: 0 10*3/uL (ref 0.0–0.5)
Eosinophils Relative: 5 %
Eosinophils Relative: 0 %
HCT: 40.7 % (ref 39.0–52.0)
HCT: 42.3 % (ref 39.0–52.0)
Hemoglobin: 14.3 g/dL (ref 13.0–17.0)
Hemoglobin: 14.7 g/dL (ref 13.0–17.0)
Immature Granulocytes: 0 %
Immature Granulocytes: 0 %
Lymphocytes Relative: 29 %
Lymphocytes Relative: 6 %
Lymphs Abs: 2 10*3/uL (ref 0.7–4.0)
Lymphs Abs: 0.5 10*3/uL — ABNORMAL LOW (ref 0.7–4.0)
MCH: 30 pg (ref 26.0–34.0)
MCH: 30.1 pg (ref 26.0–34.0)
MCHC: 35.1 g/dL (ref 30.0–36.0)
MCHC: 34.8 g/dL (ref 30.0–36.0)
MCV: 85.5 fL (ref 80.0–100.0)
MCV: 86.7 fL (ref 80.0–100.0)
Monocytes Absolute: 0.3 10*3/uL (ref 0.1–1.0)
Monocytes Absolute: 0.1 10*3/uL (ref 0.1–1.0)
Monocytes Relative: 5 %
Monocytes Relative: 1 %
Neutro Abs: 4.2 10*3/uL (ref 1.7–7.7)
Neutro Abs: 8.6 10*3/uL — ABNORMAL HIGH (ref 1.7–7.7)
Neutrophils Relative %: 61 %
Neutrophils Relative %: 93 %
Platelets: 305 10*3/uL (ref 150–400)
Platelets: 329 10*3/uL (ref 150–400)
RBC: 4.76 MIL/uL (ref 4.22–5.81)
RBC: 4.88 MIL/uL (ref 4.22–5.81)
RDW: 13.2 % (ref 11.5–15.5)
RDW: 13.2 % (ref 11.5–15.5)
WBC: 6.9 10*3/uL (ref 4.0–10.5)
WBC: 9.3 10*3/uL (ref 4.0–10.5)
nRBC: 0 % (ref 0.0–0.2)
nRBC: 0 % (ref 0.0–0.2)

## 2022-07-16 LAB — COMPREHENSIVE METABOLIC PANEL
ALT: 19 U/L (ref 0–44)
ALT: 22 U/L (ref 0–44)
AST: 21 U/L (ref 15–41)
AST: 24 U/L (ref 15–41)
Albumin: 3.7 g/dL (ref 3.5–5.0)
Albumin: 3.9 g/dL (ref 3.5–5.0)
Alkaline Phosphatase: 89 U/L (ref 38–126)
Alkaline Phosphatase: 98 U/L (ref 38–126)
Anion gap: 7 (ref 5–15)
Anion gap: 9 (ref 5–15)
BUN: 14 mg/dL (ref 6–20)
BUN: 16 mg/dL (ref 6–20)
CO2: 23 mmol/L (ref 22–32)
CO2: 22 mmol/L (ref 22–32)
Calcium: 10.9 mg/dL — ABNORMAL HIGH (ref 8.9–10.3)
Calcium: 11.3 mg/dL — ABNORMAL HIGH (ref 8.9–10.3)
Chloride: 105 mmol/L (ref 98–111)
Chloride: 103 mmol/L (ref 98–111)
Creatinine, Ser: 0.81 mg/dL (ref 0.61–1.24)
Creatinine, Ser: 1.11 mg/dL (ref 0.61–1.24)
GFR, Estimated: >60 mL/min (ref >60)
GFR, Estimated: >60 mL/min (ref >60)
Glucose, Bld: 129 mg/dL — ABNORMAL HIGH (ref 70–99)
Glucose, Bld: 175 mg/dL — ABNORMAL HIGH (ref 70–99)
Potassium: 3.8 mmol/L (ref 3.5–5.1)
Potassium: 4.5 mmol/L (ref 3.5–5.1)
Sodium: 135 mmol/L (ref 135–145)
Sodium: 134 mmol/L — ABNORMAL LOW (ref 135–145)
Total Bilirubin: 0.4 mg/dL (ref 0.3–1.2)
Total Bilirubin: 0.2 mg/dL — ABNORMAL LOW (ref 0.3–1.2)
Total Protein: 6.5 g/dL (ref 6.5–8.1)
Total Protein: 6.6 g/dL (ref 6.5–8.1)

## 2022-07-16 LAB — MAGNESIUM
Magnesium: 2 mg/dL (ref 1.7–2.4)
Magnesium: 1.9 mg/dL (ref 1.7–2.4)

## 2022-07-16 LAB — PHOSPHORUS
Phosphorus: 1.9 mg/dL — ABNORMAL LOW (ref 2.5–4.6)
Phosphorus: 2 mg/dL — ABNORMAL LOW (ref 2.5–4.6)

## 2022-07-16 LAB — RAPID URINE DRUG SCREEN, HOSP PERFORMED
Amphetamines: NOT DETECTED
Barbiturates: NOT DETECTED
Benzodiazepines: NOT DETECTED
Cocaine: POSITIVE — AB
Opiates: NOT DETECTED
Tetrahydrocannabinol: NOT DETECTED

## 2022-07-16 LAB — ETHANOL: Alcohol, Ethyl (B): <10 mg/dL (ref <10)

## 2022-07-16 LAB — HIV ANTIBODY (ROUTINE TESTING W REFLEX): HIV Screen 4th Generation wRfx: NONREACTIVE

## 2022-07-16 MED ORDER — ALBUTEROL SULFATE (2.5 MG/3ML) 0.083% IN NEBU
2.5000 mg | INHALATION_SOLUTION | RESPIRATORY_TRACT | Status: DC | PRN
  Administered 2022-07-16 (×2): 2.5 mg via RESPIRATORY_TRACT
  Filled 2022-07-16 (×2): qty 3

## 2022-07-16 MED ORDER — FAMOTIDINE 20 MG PO TABS
40.0000 mg | ORAL_TABLET | Freq: Every day | ORAL | Status: DC
  Administered 2022-07-16 – 2022-07-17 (×2): 40 mg via ORAL
  Filled 2022-07-16 (×3): qty 2

## 2022-07-16 MED ORDER — IPRATROPIUM-ALBUTEROL 0.5-2.5 (3) MG/3ML IN SOLN
3.0000 mL | Freq: Four times a day (QID) | RESPIRATORY_TRACT | Status: DC
  Administered 2022-07-16 – 2022-07-17 (×3): 3 mL via RESPIRATORY_TRACT
  Filled 2022-07-16 (×4): qty 3

## 2022-07-16 MED ORDER — MOMETASONE FURO-FORMOTEROL FUM 100-5 MCG/ACT IN AERO
2.0000 | INHALATION_SPRAY | Freq: Two times a day (BID) | RESPIRATORY_TRACT | Status: DC
  Administered 2022-07-16 – 2022-07-17 (×2): 2 via RESPIRATORY_TRACT
  Filled 2022-07-16: qty 8.8

## 2022-07-16 NOTE — Progress Notes (Signed)
Luis Gay. ZOX:[id number] DOB: 1965-10-13 DOA: 07/15/2022 PCP: Oneita Hurt, No   Subj:  Luis Gay. is a 57 y.o. BM PMHx asthma, tobacco abuse, alcohol abuse, tachycardia  Presenting with worsening shortness of breath.   Patient reports several days of worsening shortness of breath.  States that he has known history of asthma with prior exacerbation.  He currently does not have a maintenance inhaler and is taking/using his rescue inhaler almost every hour to control his symptoms.  He ran out about a week ago and has had worsening shortness of breath with worsening wheezing since that time.  Significantly worse in the last several days.  Of note, he does work [employer].   He denies fevers, chills, chest pain, abdominal pain, constipation, diarrhea, nausea, vomiting.   ED Course: Vital signs in the ED notable for blood pressure in the 110s to 140 systolic.  Lab workup included BMP with calcium stable 11.1.  CBC within normal notes.  Respiratory panel negative for flu COVID and RSV.  CT chest x-ray showed no acute normality but does show hyperinflation.  Patient received prednisone, magnesium, DuoNebs, albuterol in the ED.  Did receive a DuoNeb and route by EMS as well.  Did have improvement in symptoms but had continued wheezing and worsening of symptoms on ambulation though no desaturation.  Patient stated he felt uncomfortable going home.   Obj: 5/9 A/O x 4, states feels much better.  Although he did say had a another asthma attack earlier this morning.  Does not have PCP.  Continues to smoke.   Objective: VITAL SIGNS: Temp: 98.9 F (37.2 C) (05/09 1208) Temp Source: Oral (05/09 1208) BP: 131/82 (05/09 1208) Pulse Rate: 83 (05/09 1208)   VENTILATOR SETTINGS: **  Procedures/Significant Events: 5/8 PCXR No acute cardiopulmonary disease.  Hyperinflation    Consultants:     Cultures 5/8 influenza A/B negative 5/8 RSV negative 5/8 SARS coronavirus  negative  Antimicrobials:  No intake or output data in the 24 hours ending 07/16/22 1243   Exam: Physical Exam:  General: A/O x 4, no acute respiratory distress Eyes: negative scleral hemorrhage, negative anisocoria, negative icterus ENT: Negative Runny nose, negative gingival bleeding, Neck:  Negative scars, masses, torticollis, lymphadenopathy, JVD Lungs: decreased breath sounds bibasilar, positive expiratory wheezing, negative crackles Cardiovascular: Regular rate and rhythm without murmur gallop or rub normal S1 and S2 Abdomen: negative abdominal pain, nondistended, positive soft, bowel sounds, no rebound, no ascites, no appreciable mass Extremities: No significant cyanosis, clubbing, or edema bilateral lower extremities Skin: Negative rashes, lesions, ulcers Psychiatric:  Negative depression, negative anxiety, negative fatigue, negative mania  Central nervous system:  Cranial nerves II through XII intact, tongue/uvula midline, all extremities muscle strength 5/5, sensation intact throughout, negative dysarthria, negative expressive aphasia, negative receptive aphasia.   .   DVT prophylaxis: Lovenox Code Status: Full Family Communication:  Status is: Inpatient    Dispo: The patient is from: Home              Anticipated d/c is to: Home              Anticipated d/c date is: 1 day              Patient currently is not medically stable to d/c.      Assessment & Plan: Covid vaccination;   Principal Problem:   Asthma exacerbation Active Problems:   Tobacco abuse   Alcohol abuse   Polysubstance abuse (HCC)   Asthma exacerbation >  Patient with worsening shortness of breath after running out of> Carylon Perches entirely a week ago. > Currently no PCP.  Has not had maintenance inhaler for some time.  Has been using rescue inhaler to maintain symptoms until he ran out a week ago. > Chest x-ray without acute abnormality, flu COVID and RSV negative.  No leukocytosis. > Some  improvement with breathing treatments, magnesium, prednisone in the ED but still wheezing and worsening symptoms on ambulation with patient on comfortable going home. -DuoNeb QID - Flutter valve - Incentive spirometry - Prednisone 50 mg daily - Dulera 100-5 mcg BID -5/9 consult TOC for PCP and insurance   Tobacco abuse - Refuses nicotine patch. - Counseled that he CAN NOT smoke given his asthma.  Alcohol abuse - 5/9 EtOH<10  Polysubstance abuse - 5/9 urine drug screen positive cocaine     Mobility Assessment (last 72 hours)     Mobility Assessment     Row Name 07/15/22 2140           Does patient have an order for bedrest or is patient medically unstable No - Continue assessment       What is the highest level of mobility based on the progressive mobility assessment? Level 6 (Walks independently in room and hall) - Balance while walking in room without assist - Complete                     Time: 50 minutes         Care during the described time interval was provided by me .  I have reviewed this patient's available data, including medical history, events of note, physical examination, and all test results as part of my evaluation.

## 2022-07-16 NOTE — TOC Initial Note (Addendum)
Transition of Care (TOC) - Initial/Assessment Note    Patient Details  Name: Luis Gay. MRN: [id number] Date of Birth: 1965/04/08  Transition [facility] Care [facility] CM/SW Contact:    Janae Bridgeman, RN Phone Number: 07/16/2022, 2:29 PM  Clinical Narrative:                 CM met with the patient at the bedside to discuss TOC needs.  The patient was admitted for SOB, patient states that he ran out of his inhalers.  The patient states that he moved in with a friend in [place] named "Bo" and he will update address with nursing later.  The patient states that he has no insurance at this time.  Patient was provided with Social Services resources and patient encouraged to apply for Medicaid application online.  The patient currently works as a Scientist, water quality for [employer] and does not have access for insurance through his employment.  The patient states that he drinks alcohol daily - about 2-3 beers per day and smokes 1-2 marijuana joints per day - OP counseling resources provide at the bedside for substance abuse.  Patient currently smokes cigarettes and smokes about 1/2 ppd.  Smoking cessation included in the AVS.  The patient does not drive but has friends assist with transportation.  MATCH will be placed and attending MD will be asked to send discharge medications through the [facility] pharmacy for medication assistance.  PCP will be set up for the patient and included appointment listed in the AVS.  I was unable to reach [facility] to schedule an appointment.  I called the [facility] and asked that they call the patient's cell number to schedule an appointment in the next 1-2 weeks for hospital follow up and established PCP.  TOC will follow for discharge planning needs to return home with friend.  Expected Discharge Plan: Home/Self Care Barriers to Discharge: Continued Medical Work up   Patient Goals and CMS Choice Patient states their  goals for this hospitalization and ongoing recovery are:: To get better and return home CMS Medicare.gov Compare Post Acute Care list provided to:: Patient Choice offered to / list presented to : Patient [place] ownership interest in [facility].provided to:: Patient    Expected Discharge Plan and Services   Discharge Planning Services: CM Consult   Living arrangements for the past 2 months: Apartment (Patient just moved in with a friend "Bo" and is unable to provide address at this time)                                      Prior Living Arrangements/Services Living arrangements for the past 2 months: Apartment (Patient just moved in with a friend "Bo" and is unable to provide address at this time) Lives with:: Roommate Patient language and need for interpreter reviewed:: Yes Do you feel safe going back to the place where you live?: Yes      Need for Family Participation in Patient Care: Yes (Comment) Care giver support system in place?: Yes (comment)   Criminal Activity/Legal Involvement Pertinent to Current Situation/Hospitalization: No - Comment as needed  Activities of Daily Living Home Assistive Devices/Equipment: None ADL Screening (condition at time of admission) Patient's cognitive ability adequate to safely complete daily activities?: Yes Is the patient deaf or have difficulty hearing?: No Does the patient have difficulty seeing, even  when wearing glasses/contacts?: No Does the patient have difficulty concentrating, remembering, or making decisions?: Yes Patient able to express need for assistance with ADLs?: Yes Does the patient have difficulty dressing or bathing?: No Independently performs ADLs?: Yes (appropriate for developmental age) Does the patient have difficulty walking or climbing stairs?: No Weakness of Legs: None Weakness of Arms/Hands: None  Permission Sought/Granted Permission sought to share information with : Case Manager,  PCP Permission granted to share information with : Yes, Verbal Permission Granted              Emotional Assessment Appearance:: Appears stated age Attitude/Demeanor/Rapport: Engaged Affect (typically observed): Accepting Orientation: : Oriented to Self, Oriented to Place, Oriented to  Time, Oriented to Situation Alcohol / Substance Use: Illicit Drugs, Alcohol Use, Tobacco Use (Patient smokes 1/2 ppd cigarettes, smokes 1-2 "[employer] daily, drinks 2-3 beers per day weekdays and 6-pack per day on weekends) Psych Involvement: No (comment)  Admission diagnosis:  Moderate persistent asthma with exacerbation [J45.41] Asthma exacerbation [J45.901] Patient Active Problem List   Diagnosis Date Noted   Tobacco abuse 07/16/2022   Alcohol abuse 07/16/2022   Polysubstance abuse (HCC) 07/16/2022   Pleuritic chest pain 04/10/2015   Asthma exacerbation 09/02/2013   Cigarette smoker one half pack a day or less 09/02/2013   Tachycardia 09/02/2013   PCP:  Pcp, No Pharmacy:   [id number] [place], [place] - [address] [place] [address] Phone: [phone] Fax: [phone]  [facility] [address] [place] [address] Phone: [phone] Fax: [phone]     Social Determinants of Health (SDOH) Social History: SDOH Screenings   Food Insecurity: No Food Insecurity (07/15/2022)  Housing: Low Risk  (07/15/2022)  Transportation Needs: No Transportation Needs (07/15/2022)  Utilities: Not At Risk (07/15/2022)  Tobacco Use: High Risk (07/15/2022)   SDOH Interventions:     Readmission Risk Interventions     No data to display

## 2022-07-16 NOTE — Progress Notes (Signed)
Patient attached to a cardiac tele monitor. HR 73. Patient AOX4.

## 2022-07-16 NOTE — Plan of Care (Signed)

## 2022-07-16 NOTE — TOC CAGE-AID Note (Signed)
Transition of Care (TOC) - CAGE-AID Screening   Patient Details  Name: Luis Gay. MRN: [id number] Date of Birth: Dec 18, 1965  Transition of Care [facility]) CM/SW Contact:    Janae Bridgeman, RN Phone Number: 07/16/2022, 2:28 PM   Clinical Narrative: OP counseling and resources provided at the bedside for ETOH counseling and marijuana use - see initial TOC note.   CAGE-AID Screening:    Have You Ever Felt You Ought to Cut Down on Your Drinking or Drug Use?: Yes Have People Annoyed You By Critizing Your Drinking Or Drug Use?: No Have You Felt Bad Or Guilty About Your Drinking Or Drug Use?: No Have You Ever Had a Drink or Used Drugs First Thing In The Morning to Steady Your Nerves or to Get Rid of a Hangover?: No CAGE-AID Score: 1  Substance Abuse Education Offered: Yes  Substance abuse interventions: Patient Counseling, Educational Materials (Patient provided with Substance abuse counseling for Marijuana and alcohol use - OP resource given to patient an also included in the AVS)

## 2022-07-17 ENCOUNTER — Other Ambulatory Visit (HOSPITAL_COMMUNITY): Payer: Self-pay

## 2022-07-17 LAB — CBC WITH DIFFERENTIAL/PLATELET
Abs Immature Granulocytes: 0.06 10*3/uL (ref 0.00–0.07)
Basophils Absolute: 0.1 10*3/uL (ref 0.0–0.1)
Basophils Relative: 1 %
Eosinophils Absolute: 0.3 10*3/uL (ref 0.0–0.5)
Eosinophils Relative: 3 %
HCT: 44.5 % (ref 39.0–52.0)
Hemoglobin: 14.7 g/dL (ref 13.0–17.0)
Immature Granulocytes: 1 %
Lymphocytes Relative: 23 %
Lymphs Abs: 2.5 10*3/uL (ref 0.7–4.0)
MCH: 29.2 pg (ref 26.0–34.0)
MCHC: 33 g/dL (ref 30.0–36.0)
MCV: 88.3 fL (ref 80.0–100.0)
Monocytes Absolute: 0.7 10*3/uL (ref 0.1–1.0)
Monocytes Relative: 7 %
Neutro Abs: 7 10*3/uL (ref 1.7–7.7)
Neutrophils Relative %: 65 %
Platelets: 349 10*3/uL (ref 150–400)
RBC: 5.04 MIL/uL (ref 4.22–5.81)
RDW: 13.3 % (ref 11.5–15.5)
WBC: 10.6 10*3/uL — ABNORMAL HIGH (ref 4.0–10.5)
nRBC: 0 % (ref 0.0–0.2)

## 2022-07-17 LAB — COMPREHENSIVE METABOLIC PANEL
ALT: 18 U/L (ref 0–44)
AST: 18 U/L (ref 15–41)
Albumin: 4 g/dL (ref 3.5–5.0)
Alkaline Phosphatase: 99 U/L (ref 38–126)
Anion gap: 7 (ref 5–15)
BUN: 16 mg/dL (ref 6–20)
CO2: 24 mmol/L (ref 22–32)
Calcium: 11.7 mg/dL — ABNORMAL HIGH (ref 8.9–10.3)
Chloride: 103 mmol/L (ref 98–111)
Creatinine, Ser: 0.91 mg/dL (ref 0.61–1.24)
GFR, Estimated: >60 mL/min (ref >60)
Glucose, Bld: 101 mg/dL — ABNORMAL HIGH (ref 70–99)
Potassium: 4 mmol/L (ref 3.5–5.1)
Sodium: 134 mmol/L — ABNORMAL LOW (ref 135–145)
Total Bilirubin: 0.2 mg/dL — ABNORMAL LOW (ref 0.3–1.2)
Total Protein: 7 g/dL (ref 6.5–8.1)

## 2022-07-17 LAB — PHOSPHORUS: Phosphorus: 3.1 mg/dL (ref 2.5–4.6)

## 2022-07-17 LAB — MAGNESIUM: Magnesium: 2 mg/dL (ref 1.7–2.4)

## 2022-07-17 MED ORDER — FAMOTIDINE 40 MG PO TABS
40.0000 mg | ORAL_TABLET | Freq: Every day | ORAL | 0 refills | Status: AC
  Filled 2022-07-17: qty 30, 30d supply, fill #0

## 2022-07-17 MED ORDER — ALBUTEROL SULFATE HFA 108 (90 BASE) MCG/ACT IN AERS
2.0000 | INHALATION_SPRAY | Freq: Four times a day (QID) | RESPIRATORY_TRACT | 0 refills | Status: AC | PRN
  Filled 2022-07-17: qty 6.7, 25d supply, fill #0

## 2022-07-17 MED ORDER — FLUTICASONE-SALMETEROL 100-50 MCG/ACT IN AEPB
1.0000 | INHALATION_SPRAY | Freq: Two times a day (BID) | RESPIRATORY_TRACT | 0 refills | Status: AC
  Filled 2022-07-17: qty 13, 30d supply, fill #0
  Filled 2022-07-17: qty 60, 30d supply, fill #0

## 2022-07-17 MED ORDER — PREDNISONE 50 MG PO TABS
50.0000 mg | ORAL_TABLET | Freq: Every day | ORAL | 0 refills | Status: AC
  Filled 2022-07-17: qty 3, 3d supply, fill #0

## 2022-07-17 NOTE — Progress Notes (Signed)
Patient provided discharge instructions along with medications for discharge. Patient verbalizes understanding of discharge instructions.

## 2022-07-17 NOTE — Discharge Planning (Signed)
Patient's IV has been removed. Education has been provided. All questions answered. Patient reports no confusion or unanswered questions. Patient is stable and no distress is noted or verbalized. No pain reported. Patient has been given bus voucher per request; currently awaiting for meds from pharmacy and can be discharged safely. Resources have been provided for optimal continuum of care. Plan of care is ongoing.  Lawana Pai, RN

## 2022-07-17 NOTE — Plan of Care (Signed)

## 2022-07-17 NOTE — Discharge Summary (Signed)
Physician Discharge Summary  [name]. ZOX:[id number] DOB: 11/13/1965 DOA: 07/15/2022  PCP: Pcp, No  Admit date: 07/15/2022 Discharge date: 07/20/2022  Time spent: 30 minutes  Recommendations for Outpatient Follow-up:   Asthma exacerbation > Patient with worsening shortness of breath after running out of> Carylon Perches entirely a week ago. > Currently no PCP.  Has not had maintenance inhaler for some time.  Has been using rescue inhaler to maintain symptoms until he ran out a week ago. > Chest x-ray without acute abnormality, flu COVID and RSV negative.  No leukocytosis. > Some improvement with breathing treatments, magnesium, prednisone in the ED but still wheezing and worsening symptoms on ambulation with patient on comfortable going home. -DuoNeb QID - Flutter valve - Incentive spirometry - Prednisone 50 mg daily - Advair 100/50 1 puff bid  -5/9 consult TOC for PCP and insurance     Tobacco abuse - Refuses nicotine patch. - Counseled that he CAN NOT smoke given his asthma.   Alcohol abuse - 5/9 EtOH<10   Polysubstance abuse - 5/9 urine drug screen positive cocaine  Discharge Diagnoses:  Principal Problem:   Asthma exacerbation Active Problems:   Tobacco abuse   Alcohol abuse   Polysubstance abuse (HCC)   Discharge Condition: Stable  Diet recommendation: Regular  Filed Weights   07/15/22 0650 07/17/22 1109  Weight: 90.7 kg 90.7 kg    History of present illness:  Luis Gay. is a 57 y.o. BM PMHx asthma, tobacco abuse, alcohol abuse, tachycardia   Presenting with worsening shortness of breath.   Patient reports several days of worsening shortness of breath.  States that he has known history of asthma with prior exacerbation.  He currently does not have a maintenance inhaler and is taking/using his rescue inhaler almost every hour to control his symptoms.  He ran out about a week ago and has had worsening shortness of breath with worsening wheezing  since that time.  Significantly worse in the last several days.  Of note, he does work Counsellor.   He denies fevers, chills, chest pain, abdominal pain, constipation, diarrhea, nausea, vomiting.   ED Course: Vital signs in the ED notable for blood pressure in the 110s to 140 systolic.  Lab workup included BMP with calcium stable 11.1.  CBC within normal notes.  Respiratory panel negative for flu COVID and RSV.  CT chest x-ray showed no acute normality but does show hyperinflation.  Patient received prednisone, magnesium, DuoNebs, albuterol in the ED.  Did receive a DuoNeb and route by EMS as well.  Did have improvement in symptoms but had continued wheezing and worsening of symptoms on ambulation though no desaturation.  Patient stated he felt uncomfortable going home.  Hospital Course:  See above  Procedures/Significant Events: 5/8 PCXR No acute cardiopulmonary disease.  Hyperinflation     Consultants:        Cultures 5/8 influenza A/B negative 5/8 RSV negative 5/8 SARS coronavirus negative    Discharge Exam: Vitals:   07/17/22 0416 07/17/22 0725 07/17/22 0735 07/17/22 1109  BP: 135/77  112/60 112/60  Pulse: 77  70 70  Resp: 17  18 18   Temp: 98.3 F (36.8 C)  98.5 F (36.9 C) 98.4 F (36.9 C)  TempSrc:    Oral  SpO2: 100% 95%    Weight:    90.7 kg  Height:    5\' 9"  (1.753 m)    General: A/O x 4, no acute respiratory distress Eyes: negative scleral hemorrhage, negative  anisocoria, negative icterus ENT: Negative Runny nose, negative gingival bleeding, Neck:  Negative scars, masses, torticollis, lymphadenopathy, JVD Lungs: decreased breath sounds bibasilar, positive expiratory wheezing, negative crackles Cardiovascular: Regular rate and rhythm without murmur gallop or rub normal S1 and S2  Discharge Instructions   Allergies as of 07/17/2022   No Known Allergies      Medication List     STOP taking these medications    Flovent HFA 110 MCG/ACT  inhaler Generic drug: fluticasone       TAKE these medications    acetaminophen 325 MG tablet Commonly known as: TYLENOL Take 2 tablets (650 mg total) by mouth every 6 (six) hours as needed for mild pain (or Fever >/= 101).   albuterol 108 (90 Base) MCG/ACT inhaler Commonly known as: VENTOLIN HFA Inhale 2 puffs into the lungs every 6 (six) hours as needed for wheezing.   famotidine 40 MG tablet Commonly known as: PEPCID Take 1 tablet (40 mg total) by mouth daily.   fluticasone-salmeterol 100-50 MCG/ACT Aepb Commonly known as: ADVAIR Inhale 1 puff into the lungs 2 (two) times daily.   predniSONE 50 MG tablet Commonly known as: DELTASONE Take 1 tablet (50 mg total) by mouth daily with breakfast.       No Known Allergies  Follow-up Information     [facility]. Schedule an appointment as soon as possible for a visit.   Specialty: Internal Medicine Why: Please call the clinic and schedule a hospital follow up in the next 1-2 weeks. Contact information: [address] [place] [address] [phone]                 The results of significant diagnostics from this hospitalization (including imaging, microbiology, ancillary and laboratory) are listed below for reference.    Significant Diagnostic Studies: DG Chest Port 1 View  Result Date: 07/15/2022 CLINICAL DATA:  Shortness of breath EXAM: PORTABLE CHEST 1 VIEW COMPARISON:  X-ray 03/07/2021 and older FINDINGS: Hyperinflation. No consolidation, pneumothorax or effusion. No edema. Normal cardiopericardial silhouette. Overlapping cardiac leads. IMPRESSION: No acute cardiopulmonary disease.  Hyperinflation Electronically Signed   By: Karen Kays M.D.   On: 07/15/2022 10:02    Microbiology: Recent Results (from the past 240 hour(s))  Resp panel by RT-PCR (RSV, Flu A&B, Covid) Anterior Nasal Swab     Status: None   Collection Time: 07/15/22  9:45 AM   Specimen: Anterior Nasal Swab   Result Value Ref Range Status   SARS Coronavirus 2 by RT PCR NEGATIVE NEGATIVE Final   Influenza A by PCR NEGATIVE NEGATIVE Final   Influenza B by PCR NEGATIVE NEGATIVE Final    Comment: (NOTE) The Xpert Xpress SARS-CoV-2/FLU/RSV plus assay is intended as an aid in the diagnosis of influenza from Nasopharyngeal swab specimens and should not be used as a sole basis for treatment. Nasal washings and aspirates are unacceptable for Xpert Xpress SARS-CoV-2/FLU/RSV testing.  Fact Sheet for Patients: BloggerCourse.com  Fact Sheet for Healthcare Providers: SeriousBroker.it  This test is not yet approved or cleared by the [place] FDA and has been authorized for detection and/or diagnosis of SARS-CoV-2 by FDA under an Emergency Use Authorization (EUA). This EUA will remain in effect (meaning this test can be used) for the duration of the COVID-19 declaration under Section 564(b)(1) of the Act, 21 U.S.C. section 360bbb-3(b)(1), unless the authorization is terminated or revoked.     Resp Syncytial Virus by PCR NEGATIVE NEGATIVE Final  Comment: (NOTE) Fact Sheet for Patients: BloggerCourse.com  Fact Sheet for Healthcare Providers: SeriousBroker.it  This test is not yet approved or cleared by the [place] FDA and has been authorized for detection and/or diagnosis of SARS-CoV-2 by FDA under an Emergency Use Authorization (EUA). This EUA will remain in effect (meaning this test can be used) for the duration of the COVID-19 declaration under Section 564(b)(1) of the Act, 21 U.S.C. section 360bbb-3(b)(1), unless the authorization is terminated or revoked.  Performed at [facility], [address] [place], [place] [address]      Labs: Basic Metabolic Panel: Recent Labs  Lab 07/15/22 0654 07/16/22 0746 07/16/22 1304 07/17/22 0720  NA 137 135 134* 134*  K 4.4 3.8  4.5 4.0  CL 104 105 103 103  CO2 24 23 22 24   GLUCOSE 88 129* 175* 101*  BUN 15 14 16 16   CREATININE 0.93 0.81 1.11 0.91  CALCIUM 11.1* 10.9* 11.3* 11.7*  MG  --  2.0 1.9 2.0  PHOS  --  1.9* 2.0* 3.1   Liver Function Tests: Recent Labs  Lab 07/16/22 0746 07/16/22 1304 07/17/22 0720  AST 21 24 18   ALT 19 22 18   ALKPHOS 89 98 99  BILITOT 0.4 0.2* 0.2*  PROT 6.5 6.6 7.0  ALBUMIN 3.7 3.9 4.0   No results for input(s): "LIPASE", "AMYLASE" in the last 168 hours. No results for input(s): "AMMONIA" in the last 168 hours. CBC: Recent Labs  Lab 07/15/22 0654 07/16/22 0746 07/16/22 1304 07/17/22 0720  WBC 5.2 6.9 9.3 10.6*  NEUTROABS  --  4.2 8.6* 7.0  HGB 14.4 14.3 14.7 14.7  HCT 43.2 40.7 42.3 44.5  MCV 88.9 85.5 86.7 88.3  PLT 329 305 329 349   Cardiac Enzymes: No results for input(s): "CKTOTAL", "CKMB", "CKMBINDEX", "TROPONINI" in the last 168 hours. BNP: BNP (last 3 results) No results for input(s): "BNP" in the last 8760 hours.  ProBNP (last 3 results) No results for input(s): "PROBNP" in the last 8760 hours.  CBG: No results for input(s): "GLUCAP" in the last 168 hours.     Signed:  Carolyne Littles, MD [facility]

## 2022-07-17 NOTE — TOC Transition Note (Signed)
Transition of Care [facility]) - CM/SW Discharge Note   Patient Details  Name: Luis Gay. MRN: [id number] Date of Birth: [birth year]  Transition of Care [facility]) CM/SW Contact:  Janae Bridgeman, RN Phone Number: 07/17/2022, 11:25 AM   Clinical Narrative:    CM spoke with the pharmacy this morning for needed medication assistance for home discharge medications.  MATCH was provided for the patient to provide financial assistance for the patient - meds to be delivered to the patient's bedside from [facility] pharmacy.  Bedside nursing to provide discharge instructions.  Patient does not have transportation to home and patient was given 2 bus passes for transport to home today.   Final next level of care: Home/Self Care Barriers to Discharge: Continued Medical Work up   Patient Goals and CMS Choice CMS Medicare.gov Compare Post Acute Care list provided to:: Patient Choice offered to / list presented to : Patient  Discharge Placement                         Discharge Plan and Services Additional resources added to the After Visit Summary for     Discharge Planning Services: CM Consult                                 Social Determinants of Health (SDOH) Interventions SDOH Screenings   Food Insecurity: No Food Insecurity (07/15/2022)  Housing: Low Risk  (07/15/2022)  Transportation Needs: No Transportation Needs (07/15/2022)  Utilities: Not At Risk (07/15/2022)  Tobacco Use: High Risk (07/15/2022)     Readmission Risk Interventions     No data to display

## 2022-07-17 NOTE — Plan of Care (Signed)
Patient is resting in bed with normal vitals and respirations. Patient reports shortness of breath when ambulating. Lung sounds are clear and diminished. No report of pain or distress noted or verbalized. Safety precautions are in place. Plan of care is ongoing. Problem: Education: Goal: Knowledge of General Education information will improve Description: Including pain rating scale, medication(s)/side effects and non-pharmacologic comfort measures Outcome: Progressing   Problem: Health Behavior/Discharge Planning: Goal: Ability to manage health-related needs will improve Outcome: Progressing   Problem: Clinical Measurements: Goal: Ability to maintain clinical measurements within normal limits will improve Outcome: Progressing Goal: Will remain free from infection Outcome: Progressing Goal: Diagnostic test results will improve Outcome: Progressing Goal: Respiratory complications will improve Outcome: Progressing Goal: Cardiovascular complication will be avoided Outcome: Progressing   Problem: Activity: Goal: Risk for activity intolerance will decrease Outcome: Progressing   Problem: Nutrition: Goal: Adequate nutrition will be maintained Outcome: Progressing   Problem: Coping: Goal: Level of anxiety will decrease Outcome: Progressing   Problem: Elimination: Goal: Will not experience complications related to bowel motility Outcome: Progressing Goal: Will not experience complications related to urinary retention Outcome: Progressing   Problem: Pain Managment: Goal: General experience of comfort will improve Outcome: Progressing   Problem: Safety: Goal: Ability to remain free from injury will improve Outcome: Progressing   Problem: Skin Integrity: Goal: Risk for impaired skin integrity will decrease Outcome: Progressing

## 2023-03-30 ENCOUNTER — Ambulatory Visit: Payer: Self-pay | Admitting: Physician Assistant

## 2023-06-06 IMAGING — DX DG CHEST 1V PORT
1 series · 1 of 1 positions shown · non-contrast
Comparison: Chest x-ray 08/02/2016

CLINICAL DATA: sob

EXAM:
PORTABLE CHEST 1 VIEW

[chest]
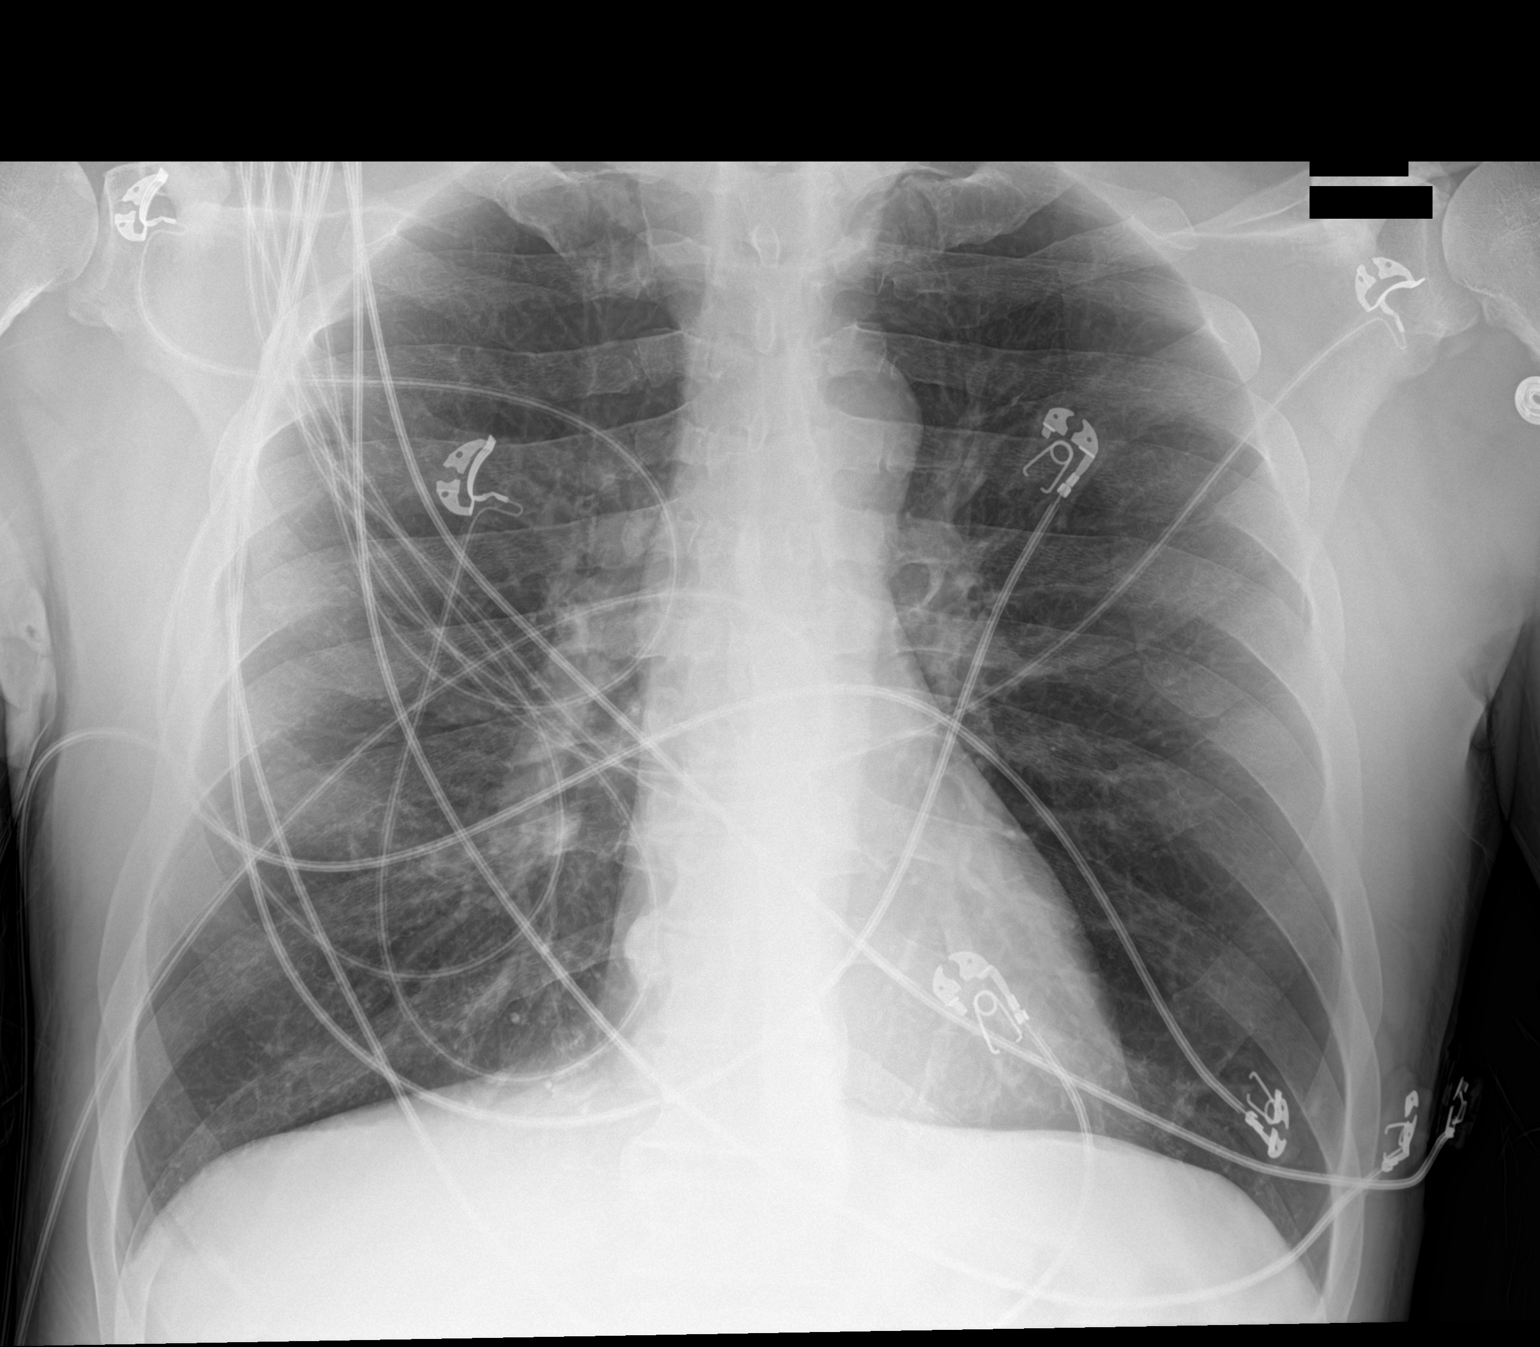

[1 of 1 positions shown; findings below may reference images not displayed]

FINDINGS: The heart and mediastinal contours are within normal limits.

No focal consolidation. No pulmonary edema. No pleural effusion. No
pneumothorax.

No acute osseous abnormality.
IMPRESSION: No active disease.
